# Patient Record
Sex: Female | Born: 1990 | Hispanic: No | Marital: Married | Smoking: Never smoker
Health system: Southern US, Community
[De-identification: ages and names within clinical notes are randomized; demographics above are authoritative.]

## PROBLEM LIST (undated history)

## (undated) ENCOUNTER — Inpatient Hospital Stay (HOSPITAL_COMMUNITY): Payer: Self-pay

## (undated) DIAGNOSIS — Z789 Other specified health status: Secondary | ICD-10-CM

## (undated) HISTORY — PX: NO PAST SURGERIES: SHX2092

---

## 2014-10-16 ENCOUNTER — Inpatient Hospital Stay (HOSPITAL_COMMUNITY)
Admission: AD | Admit: 2014-10-16 | Discharge: 2014-10-16 | Disposition: A | Discharge status: Home / Self Care | Payer: BLUE CROSS/BLUE SHIELD | Source: Ambulatory Visit | Attending: Obstetrics and Gynecology | Admitting: Obstetrics and Gynecology

## 2014-10-16 ENCOUNTER — Encounter (HOSPITAL_COMMUNITY): Payer: Self-pay | Admitting: *Deleted

## 2014-10-16 DIAGNOSIS — O23592 Infection of other part of genital tract in pregnancy, second trimester: Secondary | ICD-10-CM | POA: Insufficient documentation

## 2014-10-16 DIAGNOSIS — A499 Bacterial infection, unspecified: Secondary | ICD-10-CM | POA: Diagnosis not present

## 2014-10-16 DIAGNOSIS — O98811 Other maternal infectious and parasitic diseases complicating pregnancy, first trimester: Secondary | ICD-10-CM

## 2014-10-16 DIAGNOSIS — B373 Candidiasis of vulva and vagina: Secondary | ICD-10-CM | POA: Diagnosis not present

## 2014-10-16 DIAGNOSIS — N760 Acute vaginitis: Secondary | ICD-10-CM | POA: Diagnosis not present

## 2014-10-16 DIAGNOSIS — B3731 Acute candidiasis of vulva and vagina: Secondary | ICD-10-CM

## 2014-10-16 DIAGNOSIS — Z3A23 23 weeks gestation of pregnancy: Secondary | ICD-10-CM | POA: Diagnosis not present

## 2014-10-16 DIAGNOSIS — O98812 Other maternal infectious and parasitic diseases complicating pregnancy, second trimester: Secondary | ICD-10-CM | POA: Insufficient documentation

## 2014-10-16 DIAGNOSIS — N898 Other specified noninflammatory disorders of vagina: Secondary | ICD-10-CM | POA: Diagnosis present

## 2014-10-16 DIAGNOSIS — O0932 Supervision of pregnancy with insufficient antenatal care, second trimester: Secondary | ICD-10-CM

## 2014-10-16 DIAGNOSIS — B9689 Other specified bacterial agents as the cause of diseases classified elsewhere: Secondary | ICD-10-CM | POA: Diagnosis not present

## 2014-10-16 HISTORY — DX: Other specified health status: Z78.9

## 2014-10-16 LAB — URINALYSIS, ROUTINE W REFLEX MICROSCOPIC
BILIRUBIN URINE: NEGATIVE
GLUCOSE, UA: NEGATIVE mg/dL
HGB URINE DIPSTICK: NEGATIVE
KETONES UR: NEGATIVE mg/dL
NITRITE: NEGATIVE
PH: 6.5 (ref 5.0–8.0)
Protein, ur: NEGATIVE mg/dL
SPECIFIC GRAVITY, URINE: 1.025 (ref 1.005–1.030)
Urobilinogen, UA: 0.2 mg/dL (ref 0.0–1.0)

## 2014-10-16 LAB — WET PREP, GENITAL: TRICH WET PREP: NONE SEEN

## 2014-10-16 LAB — URINE MICROSCOPIC-ADD ON

## 2014-10-16 MED ORDER — FLUCONAZOLE 150 MG PO TABS: 150.0000 mg | ORAL_TABLET | Freq: Once | ORAL | Status: DC

## 2014-10-16 MED ORDER — METRONIDAZOLE 500 MG PO TABS: 500.0000 mg | ORAL_TABLET | Freq: Two times a day (BID) | ORAL | Status: DC

## 2014-10-16 NOTE — MAU Note (Signed)
Pt denies having any problems. Has recently moved here from [place] and because is over 20wks she is having trouble finding someone to accept her for prenatal care. She just wants to be sure everything is ok and get signed up for care. Does have some yellow d/c at times and occ itching.

## 2014-10-16 NOTE — Progress Notes (Signed)
No spec used to obtain specimens

## 2014-10-16 NOTE — MAU Provider Note (Signed)
History     CSN: [id number]  Arrival date and time: 10/16/14 1532   First Provider Initiated Contact with Patient 10/16/14 1654      Chief Complaint  Patient presents with  . Possible Pregnancy   HPI Pt is 24 yo G2P1001  pregnant who recently moved here and has not been able to establish prenatal care. Husband is Runner, broadcasting/film/video with [employer] here in [place] and just got his insurance. Pt c/o of vaginal discharge with itching. Pt has had yeast vaginal infections before- tried the vaginal cream and found it very difficult to use and would prefer tablet. Pt denies abd pain, vaginal bleeding, constipation, diarrhea, N/V. Pt has had back pain prior to pregnancy and has not worsened. Pt denies any complications with previous pregnancy that was delivered at term. RN note: Pt denies having any problems. Has recently moved here from [place] and because is over 20wks she is having trouble finding someone to accept her for prenatal care. She just wants to be sure everything is ok and get signed up for care. Does have some yellow d/c at times and occ itching.          Past Medical History  Diagnosis Date  . Medical history non-contributory     Past Surgical History  Procedure Laterality Date  . No past surgeries      Family History  Problem Relation Age of Onset  . Diabetes Mother   . Diabetes Maternal Grandfather   . Diabetes Paternal Grandfather     Social History  Substance Use Topics  . Smoking status: Never Smoker   . Smokeless tobacco: None  . Alcohol Use: No    Allergies: No Known Allergies  Prescriptions prior to admission  Medication Sig Dispense Refill Last Dose  . Prenatal Vit-Fe Fumarate-FA (PRENATAL MULTIVITAMIN) TABS tablet Take 1 tablet by mouth daily at 12 noon.   10/15/2014 at Unknown time    Review of Systems  Constitutional: Negative for fever and chills.  Gastrointestinal: Negative for nausea, vomiting, abdominal pain, diarrhea and  constipation.  Genitourinary: Negative for dysuria.  Neurological: Negative for headaches.   Physical Exam   Blood pressure 106/55, pulse 70, temperature 98.1 F (36.7 C), resp. rate 18, height  (1.626 m), weight 142 lb 6.4 oz (64.592 kg), last menstrual period 05/05/2014.  Physical Exam  Nursing note and vitals reviewed. Constitutional: She is oriented to person, place, and time. She appears well-developed and well-nourished. No distress.  HENT:  Head: Normocephalic.  Eyes: Pupils are equal, round, and reactive to light.  Neck: Normal range of motion. Neck supple.  Cardiovascular: Normal rate.   Respiratory: Effort normal.  GI: Soft. She exhibits no distension. There is no tenderness. There is no rebound and no guarding.  Gravid AGA; FHR 140 bpm with doppler  Genitourinary: Vaginal discharge found.  Blind swab for wet prep and GC and Chlamydia; small-mod amount white thick creamy discharge  Musculoskeletal: Normal range of motion.  Neurological: She is alert and oriented to person, place, and time.  Skin: Skin is warm and dry.  Psychiatric: She has a normal mood and affect.    MAU Course  Procedures Results for orders placed or performed during the hospital encounter of 10/16/14 (from the past 24 hour(s))  Urinalysis, Routine w reflex microscopic (not at [facility])     Status: Abnormal   Collection Time: 10/16/14  4:10 PM  Result Value Ref Range   Color, Urine YELLOW YELLOW   APPearance CLEAR  CLEAR   Specific Gravity, Urine 1.025 1.005 - 1.030   pH 6.5 5.0 - 8.0   Glucose, UA NEGATIVE NEGATIVE mg/dL   Hgb urine dipstick NEGATIVE NEGATIVE   Bilirubin Urine NEGATIVE NEGATIVE   Ketones, ur NEGATIVE NEGATIVE mg/dL   Protein, ur NEGATIVE NEGATIVE mg/dL   Urobilinogen, UA 0.2 0.0 - 1.0 mg/dL   Nitrite NEGATIVE NEGATIVE   Leukocytes, UA MODERATE (A) NEGATIVE  Urine microscopic-add on     Status: None   Collection Time: 10/16/14  4:10 PM  Result Value Ref Range   Squamous  Epithelial / LPF RARE RARE   WBC, UA 0-2 <3 WBC/hpf   RBC / HPF 0-2 <3 RBC/hpf   Bacteria, UA RARE RARE   Urine-Other YEAST   Wet prep, genital     Status: Abnormal   Collection Time: 10/16/14  5:15 PM  Result Value Ref Range   Yeast Wet Prep HPF POC MODERATE (A) NONE SEEN   Trich, Wet Prep NONE SEEN NONE SEEN   Clue Cells Wet Prep HPF POC MODERATE (A) NONE SEEN   WBC, Wet Prep HPF POC MODERATE (A) NONE SEEN  GC/chlamydia pending   Assessment and Plan  Late to care [redacted]w[redacted]d IUP Yeast vaginosis- diflucan  one now and repeat in 3 days if sx persist BV- Flagyl   Order for US anatomy scan with results to be sent to clinic Message to [facility] clinic to call pt with appointment for [facility] appointment  LINEBERRY,SUSAN 10/16/2014, 4:55 PM

## 2014-10-16 NOTE — Discharge Instructions (Signed)
Safe Medications in Pregnancy   Acne: Benzoyl Peroxide Salicylic Acid  Backache/Headache: Tylenol: 2 regular strength every 4 hours OR              2 Extra strength every 6 hours  Colds/Coughs/Allergies: Benadryl (alcohol free) 25 mg every 6 hours as needed Breath right strips Claritin Cepacol throat lozenges Chloraseptic throat spray Cold-Eeze- up to three times per day Cough drops, alcohol free Flonase (by prescription only) Guaifenesin Mucinex Robitussin DM (plain only, alcohol free) Saline nasal spray/drops Sudafed (pseudoephedrine) & Actifed ** use only after [redacted] weeks gestation and if you do not have high blood pressure Tylenol Vicks Vaporub Zinc lozenges Zyrtec   Constipation: Colace Ducolax suppositories Fleet enema Glycerin suppositories Metamucil Milk of magnesia Miralax Senokot Smooth move tea  Diarrhea: Kaopectate Imodium A-D  *NO pepto Bismol  Hemorrhoids: Anusol Anusol HC Preparation H Tucks  Indigestion: Tums Maalox Mylanta Zantac  Pepcid  Insomnia: Benadryl (alcohol free)  every 6 hours as needed Tylenol PM Unisom, no Gelcaps  Leg Cramps: Tums MagGel  Nausea/Vomiting:  Bonine Dramamine Emetrol Ginger extract Sea bands Meclizine  Nausea medication to take during pregnancy:  Unisom (doxylamine succinate 25 mg tablets) Take one tablet daily at bedtime. If symptoms are not adequately controlled, the dose can be increased to a maximum recommended dose of two tablets daily (1/2 tablet in the morning, 1/2 tablet mid-afternoon and one at bedtime). Vitamin B6  tablets. Take one tablet twice a day (up to 200 mg per day).  Skin Rashes: Aveeno products Benadryl cream or  every 6 hours as needed Calamine Lotion 1% cortisone cream  Yeast infection: Gyne-lotrimin 7 Monistat 7   **If taking multiple medications, please check labels to avoid duplicating the same active ingredients **take medication as directed on  the label ** Do not exceed 4000 mg of tylenol in 24 hours **Do not take medications that contain aspirin or ibuprofen   Bacterial Vaginosis Bacterial vaginosis is a vaginal infection that occurs when the normal balance of bacteria in the vagina is disrupted. It results from an overgrowth of certain bacteria. This is the most common vaginal infection in women of childbearing age. Treatment is important to prevent complications, especially in pregnant women, as it can cause a premature delivery. CAUSES  Bacterial vaginosis is caused by an increase in harmful bacteria that are normally present in smaller amounts in the vagina. Several different kinds of bacteria can cause bacterial vaginosis. However, the reason that the condition develops is not fully understood. RISK FACTORS Certain activities or behaviors can put you at an increased risk of developing bacterial vaginosis, including:  Having a new sex partner or multiple sex partners.  Douching.  Using an intrauterine device (IUD) for contraception. Women do not get bacterial vaginosis from toilet seats, bedding, swimming pools, or contact with objects around them. SIGNS AND SYMPTOMS  Some women with bacterial vaginosis have no signs or symptoms. Common symptoms include:  Grey vaginal discharge.  A fishlike odor with discharge, especially after sexual intercourse.  Itching or burning of the vagina and vulva.  Burning or pain with urination. DIAGNOSIS  Your health care provider will take a medical history and examine the vagina for signs of bacterial vaginosis. A sample of vaginal fluid may be taken. Your health care provider will look at this sample under a microscope to check for bacteria and abnormal cells. A vaginal pH test may also be done.  TREATMENT  Bacterial vaginosis may be treated with antibiotic medicines. These may  be given in the form of a pill or a vaginal cream. A second round of antibiotics may be prescribed if the  condition comes back after treatment.  HOME CARE INSTRUCTIONS   Only take over-the-counter or prescription medicines as directed by your health care provider.  If antibiotic medicine was prescribed, take it as directed. Make sure you finish it even if you start to feel better.  Do not have sex until treatment is completed.  Tell all sexual partners that you have a vaginal infection. They should see their health care provider and be treated if they have problems, such as a mild rash or itching.  Practice safe sex by using condoms and only having one sex partner. SEEK MEDICAL CARE IF:   Your symptoms are not improving after 3 days of treatment.  You have increased discharge or pain.  You have a fever. MAKE SURE YOU:   Understand these instructions.  Will watch your condition.  Will get help right away if you are not doing well or get worse. FOR MORE INFORMATION  Centers for Disease Control and Prevention, Division of STD Prevention: [employer] American Sexual Health Association (ASHA): www.ashastd.org  Document Released: 01/05/2005 Document Revised: 10/26/2012 Document Reviewed: 08/17/2012 [facility] Patient Information 2015 [place], [place]. This information is not intended to replace advice given to you by your health care provider. Make sure you discuss any questions you have with your health care provider.  Bacterial Vaginosis Bacterial vaginosis is a vaginal infection that occurs when the normal balance of bacteria in the vagina is disrupted. It results from an overgrowth of certain bacteria. This is the most common vaginal infection in women of childbearing age. Treatment is important to prevent complications, especially in pregnant women, as it can cause a premature delivery. CAUSES  Bacterial vaginosis is caused by an increase in harmful bacteria that are normally present in smaller amounts in the vagina. Several different kinds of bacteria can cause bacterial vaginosis.  However, the reason that the condition develops is not fully understood. RISK FACTORS Certain activities or behaviors can put you at an increased risk of developing bacterial vaginosis, including:  Having a new sex partner or multiple sex partners.  Douching.  Using an intrauterine device (IUD) for contraception. Women do not get bacterial vaginosis from toilet seats, bedding, swimming pools, or contact with objects around them. SIGNS AND SYMPTOMS  Some women with bacterial vaginosis have no signs or symptoms. Common symptoms include:  Grey vaginal discharge.  A fishlike odor with discharge, especially after sexual intercourse.  Itching or burning of the vagina and vulva.  Burning or pain with urination. DIAGNOSIS  Your health care provider will take a medical history and examine the vagina for signs of bacterial vaginosis. A sample of vaginal fluid may be taken. Your health care provider will look at this sample under a microscope to check for bacteria and abnormal cells. A vaginal pH test may also be done.  TREATMENT  Bacterial vaginosis may be treated with antibiotic medicines. These may be given in the form of a pill or a vaginal cream. A second round of antibiotics may be prescribed if the condition comes back after treatment.  HOME CARE INSTRUCTIONS   Only take over-the-counter or prescription medicines as directed by your health care provider.  If antibiotic medicine was prescribed, take it as directed. Make sure you finish it even if you start to feel better.  Do not have sex until treatment is completed.  Tell all sexual partners that  you have a vaginal infection. They should see their health care provider and be treated if they have problems, such as a mild rash or itching.  Practice safe sex by using condoms and only having one sex partner. SEEK MEDICAL CARE IF:   Your symptoms are not improving after 3 days of treatment.  You have increased discharge or pain.  You  have a fever. MAKE SURE YOU:   Understand these instructions.  Will watch your condition.  Will get help right away if you are not doing well or get worse. FOR MORE INFORMATION  Centers for Disease Control and Prevention, Division of STD Prevention: [employer] American Sexual Health Association (ASHA): www.ashastd.org  Document Released: 01/05/2005 Document Revised: 10/26/2012 Document Reviewed: 08/17/2012 [facility] Patient Information 2015 [place], [place]. This information is not intended to replace advice given to you by your health care provider. Make sure you discuss any questions you have with your health care provider.

## 2014-10-17 LAB — GC/CHLAMYDIA PROBE AMP (~~LOC~~) NOT AT ARMC
Chlamydia: NEGATIVE
Neisseria Gonorrhea: NEGATIVE

## 2014-10-29 ENCOUNTER — Encounter: Payer: Self-pay | Admitting: Advanced Practice Midwife

## 2014-10-29 ENCOUNTER — Ambulatory Visit (INDEPENDENT_AMBULATORY_CARE_PROVIDER_SITE_OTHER): Payer: BLUE CROSS/BLUE SHIELD | Admitting: Advanced Practice Midwife

## 2014-10-29 VITALS — BP 107/58 | HR 75 | Temp 98.0°F | Wt 147.3 lb

## 2014-10-29 DIAGNOSIS — O0932 Supervision of pregnancy with insufficient antenatal care, second trimester: Secondary | ICD-10-CM | POA: Insufficient documentation

## 2014-10-29 LAB — POCT URINALYSIS DIP (DEVICE)
Bilirubin Urine: NEGATIVE
Glucose, UA: NEGATIVE mg/dL
Hgb urine dipstick: NEGATIVE
KETONES UR: NEGATIVE mg/dL
Nitrite: NEGATIVE
PH: 7 (ref 5.0–8.0)
PROTEIN: NEGATIVE mg/dL
SPECIFIC GRAVITY, URINE: 1.015 (ref 1.005–1.030)
UROBILINOGEN UA: 0.2 mg/dL (ref 0.0–1.0)

## 2014-10-29 NOTE — Patient Instructions (Signed)
Influenza (Flu) Vaccine (Inactivated or Recombinant):  1. Why get vaccinated? Influenza ("flu") is a contagious disease that spreads around the [place] every year, usually between October and May. Flu is caused by influenza viruses, and is spread mainly by coughing, sneezing, and close contact. Anyone can get flu. Flu strikes suddenly and can last several days. Symptoms vary by age, but can include:  fever/chills  sore throat  muscle aches  fatigue  cough  headache  runny or stuffy nose Flu can also lead to pneumonia and blood infections, and cause diarrhea and seizures in children. If you have a medical condition, such as heart or lung disease, flu can make it worse. Flu is more dangerous for some people. Infants and young children, people 65 years of age and older, pregnant women, and people with certain health conditions or a weakened immune system are at greatest risk. Each year thousands of people in the [place] die from flu, and many more are hospitalized. Flu vaccine can:  keep you from getting flu,  make flu less severe if you do get it, and  keep you from spreading flu to your family and other people. 2. Inactivated and recombinant flu vaccines A dose of flu vaccine is recommended every flu season. Children 6 months through 8 years of age may need two doses during the same flu season. Everyone else needs only one dose each flu season. Some inactivated flu vaccines contain a very small amount of a mercury-based preservative called thimerosal. Studies have not shown thimerosal in vaccines to be harmful, but flu vaccines that do not contain thimerosal are available. There is no live flu virus in flu shots. They cannot cause the flu. There are many flu viruses, and they are always changing. Each year a new flu vaccine is made to protect against three or four viruses that are likely to cause disease in the upcoming flu season. But even when the vaccine doesn't exactly  match these viruses, it may still provide some protection. Flu vaccine cannot prevent:  flu that is caused by a virus not covered by the vaccine, or  illnesses that look like flu but are not. It takes about 2 weeks for protection to develop after vaccination, and protection lasts through the flu season. 3. Some people should not get this vaccine Tell the person who is giving you the vaccine:  If you have any severe, life-threatening allergies. If you ever had a life-threatening allergic reaction after a dose of flu vaccine, or have a severe allergy to any part of this vaccine, you may be advised not to get vaccinated. Most, but not all, types of flu vaccine contain a small amount of egg protein.  If you ever had Guillain-Barre Syndrome (also called GBS). Some people with a history of GBS should not get this vaccine. This should be discussed with your doctor.  If you are not feeling well. It is usually okay to get flu vaccine when you have a mild illness, but you might be asked to come back when you feel better. 4. Risks of a vaccine reaction With any medicine, including vaccines, there is a chance of reactions. These are usually mild and go away on their own, but serious reactions are also possible. Most people who get a flu shot do not have any problems with it. Minor problems following a flu shot include:  soreness, redness, or swelling where the shot was given  hoarseness  sore, red or itchy eyes  cough    fever  aches  headache  itching  fatigue If these problems occur, they usually begin soon after the shot and last 1 or 2 days. More serious problems following a flu shot can include the following:  There may be a small increased risk of Guillain-Barre Syndrome (GBS) after inactivated flu vaccine. This risk has been estimated at 1 or 2 additional cases per million people vaccinated. This is much lower than the risk of severe complications from flu, which can be prevented by  flu vaccine.  Young children who get the flu shot along with pneumococcal vaccine (PCV13) and/or DTaP vaccine at the same time might be slightly more likely to have a seizure caused by fever. Ask your doctor for more information. Tell your doctor if a child who is getting flu vaccine has ever had a seizure. Problems that could happen after any injected vaccine:  People sometimes faint after a medical procedure, including vaccination. Sitting or lying down for about 15 minutes can help prevent fainting, and injuries caused by a fall. Tell your doctor if you feel dizzy, or have vision changes or ringing in the ears.  Some people get severe pain in the shoulder and have difficulty moving the arm where a shot was given. This happens very rarely.  Any medication can cause a severe allergic reaction. Such reactions from a vaccine are very rare, estimated at about 1 in a million doses, and would happen within a few minutes to a few hours after the vaccination. As with any medicine, there is a very remote chance of a vaccine causing a serious injury or death. The safety of vaccines is always being monitored. For more information, visit: www.cdc.gov/vaccinesafety/ 5. What if there is a serious reaction? What should I look for?  Look for anything that concerns you, such as signs of a severe allergic reaction, very high fever, or unusual behavior. Signs of a severe allergic reaction can include hives, swelling of the face and throat, difficulty breathing, a fast heartbeat, dizziness, and weakness. These would start a few minutes to a few hours after the vaccination. What should I do?  If you think it is a severe allergic reaction or other emergency that can't wait, call 9-1-1 and get the person to the nearest hospital. Otherwise, call your doctor.  Reactions should be reported to the Vaccine Adverse Event Reporting System (VAERS). Your doctor should file this report, or you can do it yourself through the  VAERS web site at www.vaers.hhs.gov, or by calling 1-800-822-7967. VAERS does not give medical advice. 6. The National Vaccine Injury Compensation Program The National Vaccine Injury Compensation Program (VICP) is a federal program that was created to compensate people who may have been injured by certain vaccines. Persons who believe they may have been injured by a vaccine can learn about the program and about filing a claim by calling 1-800-338-2382 or visiting the VICP website at www.hrsa.gov/vaccinecompensation. There is a time limit to file a claim for compensation. 7. How can I learn more?  Ask your healthcare provider. He or she can give you the vaccine package insert or suggest other sources of information.  Call your local or state health department.  Contact the Centers for Disease Control and Prevention (CDC):  Call 1-800-232-4636 (1-800-CDC-INFO) or  Visit CDC's website at www.cdc.gov/flu Vaccine Information Statement Inactivated Influenza Vaccine (08/25/2013)   This information is not intended to replace advice given to you by your health care provider. Make sure you discuss any questions you have with   your health care provider.   Document Released: 10/30/2005 Document Revised: 01/26/2014 Document Reviewed: 08/28/2013 Elsevier Interactive Patient Education 2016 Elsevier Inc[employer] Td Vaccine (Tetanus and Diphtheria): What You Need to Know 1. Why get vaccinated? Tetanus  and diphtheria are very serious diseases. They are rare in the [place] today, but people who do become infected often have severe complications. Td vaccine is used to protect adolescents and adults from both of these diseases. Both tetanus and diphtheria are infections caused by bacteria. Diphtheria spreads from person to person through coughing or sneezing. Tetanus-causing bacteria enter the body through cuts, scratches, or wounds. TETANUS (Lockjaw) causes painful muscle tightening and stiffness, usually all  over the body.  It can lead to tightening of muscles in the head and neck so you can't open your mouth, swallow, or sometimes even breathe. Tetanus kills about 1 out of every 10 people who are infected even after receiving the best medical care. DIPHTHERIA can cause a thick coating to form in the back of the throat.  It can lead to breathing problems, paralysis, heart failure, and death. Before vaccines, as many as 200,000 cases of diphtheria and hundreds of cases of tetanus were reported in the [place] each year. Since vaccination began, reports of cases for both diseases have dropped by about 99%. 2. Td vaccine Td vaccine can protect adolescents and adults from tetanus and diphtheria. Td is usually given as a booster dose every 10 years but it can also be given earlier after a severe and dirty wound or burn. Another vaccine, called Tdap, which protects against pertussis in addition to tetanus and diphtheria, is sometimes recommended instead of Td vaccine. Your doctor or the person giving you the vaccine can give you more information. Td may safely be given at the same time as other vaccines. 3. Some people should not get this vaccine  A person who has ever had a life-threatening allergic reaction after a previous dose of any tetanus or diphtheria containing vaccine, OR has a severe allergy to any part of this vaccine, should not get Td vaccine. Tell the person giving the vaccine about any severe allergies.  Talk to your doctor if you:  have seizures or another nervous system problem,  had severe pain or swelling after any vaccine containing diphtheria or tetanus,  ever had a condition called Guillain Barre Syndrome (GBS),  aren't feeling well on the day the shot is scheduled. 4. Risks of a vaccine reaction With any medicine, including vaccines, there is a chance of side effects. These are usually mild and go away on their own. Serious reactions are also possible but are rare. Most  people who get Td vaccine do not have any problems with it. Mild Problems  following Td vaccine: (Did not interfere with activities)  Pain where the shot was given (about 8 people in 10)  Redness or swelling where the shot was given (about 1 person in 4)  Mild fever (rare)  Headache (about 1 person in 4)  Tiredness (about 1 person in 4) Moderate Problems following Td vaccine: (Interfered with activities, but did not require medical attention)  Fever over 102F (rare) Severe Problems  following Td vaccine: (Unable to perform usual activities; required medical attention)  Swelling, severe pain, bleeding and/or redness in the arm where the shot was given (rare). Problems that could happen after any vaccine:  People sometimes faint after a medical procedure, including vaccination. Sitting or lying down for about 15 minutes can help prevent fainting,   and injuries caused by a fall. Tell your doctor if you feel dizzy, or have vision changes or ringing in the ears.  Some people get severe pain in the shoulder and have difficulty moving the arm where a shot was given. This happens very rarely.  Any medication can cause a severe allergic reaction. Such reactions from a vaccine are very rare, estimated at fewer than 1 in a million doses, and would happen within a few minutes to a few hours after the vaccination. As with any medicine, there is a very remote chance of a vaccine causing a serious injury or death. The safety of vaccines is always being monitored. For more information, visit: www.cdc.gov/vaccinesafety/ 5. What if there is a serious reaction? What should I look for?  Look for anything that concerns you, such as signs of a severe allergic reaction, very high fever, or unusual behavior. Signs of a severe allergic reaction can include hives, swelling of the face and throat, difficulty breathing, a fast heartbeat, dizziness, and weakness. These would usually start a few minutes to a few  hours after the vaccination. What should I do?  If you think it is a severe allergic reaction or other emergency that can't wait, call 9-1-1 or get the person to the nearest hospital. Otherwise, call your doctor.  Afterward, the reaction should be reported to the Vaccine Adverse Event Reporting System (VAERS). Your doctor might file this report, or you can do it yourself through the VAERS web site at www.vaers.hhs.gov, or by calling 1-800-822-7967. VAERS does not give medical advice. 6. The National Vaccine Injury Compensation Program The National Vaccine Injury Compensation Program (VICP) is a federal program that was created to compensate people who may have been injured by certain vaccines. Persons who believe they may have been injured by a vaccine can learn about the program and about filing a claim by calling 1-800-338-2382 or visiting the VICP website at www.hrsa.gov/vaccinecompensation. There is a time limit to file a claim for compensation. 7. How can I learn more?  Ask your doctor. He or she can give you the vaccine package insert or suggest other sources of information.  Call your local or state health department.  Contact the Centers for Disease Control and Prevention (CDC):  Call 1-800-232-4636 (1-800-CDC-INFO)  Visit CDC's website at www.cdc.gov/vaccines CDC Td Vaccine VIS (03/14/13)   This information is not intended to replace advice given to you by your health care provider. Make sure you discuss any questions you have with your health care provider.   Document Released: 11/02/2005 Document Revised: 01/26/2014 Document Reviewed: 04/19/2013 Elsevier Interactive Patient Education 2016 Elsevier Inc.  

## 2014-10-29 NOTE — Progress Notes (Signed)
Breastfeeding tip of the week reviewed Initial prenatal labs today

## 2014-10-29 NOTE — Progress Notes (Signed)
New OB. See Smart Set.  Subjective:    Janice Moore (goes by "[name]" or by "[name]")  is a G2P1001 [redacted]w[redacted]d being seen today for her first obstetrical visit.  Her obstetrical history is significant for No prenatal care. Patient does intend to breast feed. Pregnancy history fully reviewed.  She moved from [place] to [place] in 2012.  She had a normal vaginal delivery in [place] in 2014. States baby was 8+9 and she had no problems with the vaginal delivery or with the pregnancy. Denies any general medical problems.   Patient reports no bleeding, no contractions, no cramping and no leaking.  Filed Vitals:   10/29/14 1339  BP: 107/58  Pulse: 75  Temp: 98 F (36.7 C)  Weight: 66.815 kg (147 lb 4.8 oz)    HISTORY: OB History  Gravida Para Term Preterm AB SAB TAB Ectopic Multiple Living  # Outcome Date GA Lbr Len/2nd Weight Sex Delivery Anes PTL Lv  2 Current           1 Term 11/20/12    Heide Scales     Past Medical History  Diagnosis Date  . Medical history non-contributory    Past Surgical History  Procedure Laterality Date  . No past surgeries     Family History  Problem Relation Age of Onset  . Diabetes Mother   . Diabetes Maternal Grandfather   . Diabetes Paternal Grandfather      Exam    Uterus:  Fundal Height: 25 cm  Pelvic Exam:    Perineum: Refuses exam   Vulva: Refuses exam   Vagina:  Refuses exam. Being treated for yeast and BV   pH:    Cervix: Refused exam   Adnexa: not evaluated   Bony Pelvis: proven to 8+9  System: Breast:  normal appearance, no masses or tenderness   Skin: normal coloration and turgor, no rashes    Neurologic: oriented, not examined   Extremities: normal strength, tone, and muscle mass   HEENT neck supple with midline trachea   Mouth/Teeth mucous membranes moist, pharynx normal without lesions   Neck supple   Cardiovascular: regular rate and rhythm   Respiratory:  appears well, vitals  normal, no respiratory distress, acyanotic, normal RR, ear and throat exam is normal, neck free of mass or lymphadenopathy, chest clear, no wheezing, crepitations, rhonchi, normal symmetric air entry   Abdomen: soft, non-tender; bowel sounds normal; no masses,  no organomegaly   Urinary: Refused exam      Assessment:    Pregnancy: G2P1001 Patient Active Problem List   Diagnosis Date Noted  . Late prenatal care affecting pregnancy in second trimester, antepartum 10/29/2014        Plan:     Initial labs drawn. Prenatal vitamins. Problem list reviewed and updated. Genetic Screening discussed First Screen: Too late.  Ultrasound discussed; fetal survey: ordered.  Follow up in 3 weeks. 50% of 30 min visit spent on counseling and coordination of care.   Routines reviewed Desires female only providers. Explained our limitations, but we will try.  Declines pap smear.  Ultrasound ordered for this Friday.   [facility] 10/29/2014

## 2014-10-30 LAB — PRESCRIPTION MONITORING PROFILE (19 PANEL)
AMPHETAMINE/METH: NEGATIVE ng/mL
BARBITURATE SCREEN, URINE: NEGATIVE ng/mL
BENZODIAZEPINE SCREEN, URINE: NEGATIVE ng/mL
BUPRENORPHINE, URINE: NEGATIVE ng/mL
Cannabinoid Scrn, Ur: NEGATIVE ng/mL
Carisoprodol, Urine: NEGATIVE ng/mL
Cocaine Metabolites: NEGATIVE ng/mL
Creatinine, Urine: 50.89 mg/dL (ref >20.0)
Fentanyl, Ur: NEGATIVE ng/mL
MDMA URINE: NEGATIVE ng/mL
METHAQUALONE SCREEN (URINE): NEGATIVE ng/mL
Meperidine, Ur: NEGATIVE ng/mL
Methadone Screen, Urine: NEGATIVE ng/mL
NITRITES URINE, INITIAL: NEGATIVE ug/mL
OPIATE SCREEN, URINE: NEGATIVE ng/mL
Oxycodone Screen, Ur: NEGATIVE ng/mL
PROPOXYPHENE: NEGATIVE ng/mL
Phencyclidine, Ur: NEGATIVE ng/mL
TAPENTADOLUR: NEGATIVE ng/mL
Tramadol Scrn, Ur: NEGATIVE ng/mL
ZOLPIDEM, URINE: NEGATIVE ng/mL
pH, Initial: 8 pH (ref 4.5–8.9)

## 2014-10-30 LAB — OBSTETRIC PANEL
ANTIBODY SCREEN: NEGATIVE
Basophils Absolute: 0 10*3/uL (ref 0.0–0.1)
Basophils Relative: 0 % (ref 0–1)
EOS PCT: 1 % (ref 0–5)
Eosinophils Absolute: 0.1 10*3/uL (ref 0.0–0.7)
HCT: 33.8 % — ABNORMAL LOW (ref 36.0–46.0)
Hemoglobin: 11.5 g/dL — ABNORMAL LOW (ref 12.0–15.0)
Hepatitis B Surface Ag: NEGATIVE
LYMPHS ABS: 1.2 10*3/uL (ref 0.7–4.0)
LYMPHS PCT: 14 % (ref 12–46)
MCH: 28.8 pg (ref 26.0–34.0)
MCHC: 34 g/dL (ref 30.0–36.0)
MCV: 84.7 fL (ref 78.0–100.0)
MONO ABS: 0.5 10*3/uL (ref 0.1–1.0)
MONOS PCT: 6 % (ref 3–12)
MPV: 9.8 fL (ref 8.6–12.4)
Neutro Abs: 7 10*3/uL (ref 1.7–7.7)
Neutrophils Relative %: 79 % — ABNORMAL HIGH (ref 43–77)
PLATELETS: 184 10*3/uL (ref 150–400)
RBC: 3.99 MIL/uL (ref 3.87–5.11)
RDW: 13.6 % (ref 11.5–15.5)
RH TYPE: POSITIVE
RUBELLA: 4.18 {index} — AB (ref <0.90)
WBC: 8.9 10*3/uL (ref 4.0–10.5)

## 2014-10-30 LAB — URINE CULTURE: Colony Count: 30000

## 2014-10-31 LAB — HEMOGLOBINOPATHY EVALUATION
HGB A2 QUANT: 3 % (ref 2.2–3.2)
HGB A: 97 % (ref 96.8–97.8)
HGB F QUANT: 0 % (ref 0.0–2.0)
HGB S QUANTITAION: 0 %
Hemoglobin Other: 0 %

## 2014-11-02 ENCOUNTER — Other Ambulatory Visit: Payer: Self-pay | Admitting: Advanced Practice Midwife

## 2014-11-02 ENCOUNTER — Ambulatory Visit (HOSPITAL_COMMUNITY)
Admission: RE | Admit: 2014-11-02 | Discharge: 2014-11-02 | Disposition: A | Discharge status: Home / Self Care | Payer: BLUE CROSS/BLUE SHIELD | Source: Ambulatory Visit | Attending: Advanced Practice Midwife | Admitting: Advanced Practice Midwife

## 2014-11-02 DIAGNOSIS — Z3A25 25 weeks gestation of pregnancy: Secondary | ICD-10-CM

## 2014-11-02 DIAGNOSIS — O35EXX0 Maternal care for other (suspected) fetal abnormality and damage, fetal genitourinary anomalies, not applicable or unspecified: Secondary | ICD-10-CM

## 2014-11-02 DIAGNOSIS — O283 Abnormal ultrasonic finding on antenatal screening of mother: Secondary | ICD-10-CM | POA: Diagnosis not present

## 2014-11-02 DIAGNOSIS — O358XX0 Maternal care for other (suspected) fetal abnormality and damage, not applicable or unspecified: Secondary | ICD-10-CM

## 2014-11-02 DIAGNOSIS — O0930 Supervision of pregnancy with insufficient antenatal care, unspecified trimester: Secondary | ICD-10-CM

## 2014-11-02 DIAGNOSIS — O0932 Supervision of pregnancy with insufficient antenatal care, second trimester: Secondary | ICD-10-CM

## 2014-11-02 DIAGNOSIS — Z36 Encounter for antenatal screening of mother: Secondary | ICD-10-CM | POA: Diagnosis present

## 2014-11-02 DIAGNOSIS — Z3689 Encounter for other specified antenatal screening: Secondary | ICD-10-CM

## 2014-11-20 ENCOUNTER — Ambulatory Visit (INDEPENDENT_AMBULATORY_CARE_PROVIDER_SITE_OTHER): Payer: BLUE CROSS/BLUE SHIELD | Admitting: Certified Nurse Midwife

## 2014-11-20 VITALS — BP 102/63 | HR 86 | Temp 98.1°F | Wt 151.0 lb

## 2014-11-20 DIAGNOSIS — Z3483 Encounter for supervision of other normal pregnancy, third trimester: Secondary | ICD-10-CM | POA: Diagnosis not present

## 2014-11-20 LAB — POCT URINALYSIS DIP (DEVICE)
BILIRUBIN URINE: NEGATIVE
Glucose, UA: NEGATIVE mg/dL
HGB URINE DIPSTICK: NEGATIVE
Ketones, ur: NEGATIVE mg/dL
LEUKOCYTES UA: NEGATIVE
Nitrite: NEGATIVE
Protein, ur: NEGATIVE mg/dL
Specific Gravity, Urine: 1.01 (ref 1.005–1.030)
Urobilinogen, UA: 0.2 mg/dL (ref 0.0–1.0)
pH: 6.5 (ref 5.0–8.0)

## 2014-11-20 NOTE — Patient Instructions (Signed)
Glucose Tolerance Test The glucose tolerance test (GTT) is one of several tests used to diagnose diabetes mellitus. The GTT is a blood test, and it may include a urine test as well. The GTT checks to see how your body processes sugar (glucose). For this test, you will consume a drink containing a high level of glucose. Your blood glucose levels will be checked before you consume the drink and then again 1, 2, 3, and possibly 4 hours after you consume it. Your health care provider may recommend that you have the GTT if you:  Have a family history of diabetes.   Are very overweight (obese).   Have experienced infections that keep coming back.   Have had numerous cuts or wounds that did not heal quickly, especially on your legs and feet.   Are a woman and have a history of giving birth to very large babies or a history of repeated fetal loss (stillbirth).  Have had glucose in your urine or high blood sugar:   During pregnancy.   After a heart attack, surgery, or prolonged periods of high stress.  The GTT lasts 3-4 hours. Other than the glucose solution, you will not be allowed to eat or drink anything during the test. You must remain at the testing location to make sure that your blood and urine samples are taken on time. PREPARATION FOR TEST Eat normally for 3 days prior to the GTT test, including having plenty of carbohydrate-rich foods. Do not eat or drink anything except water during the final 12 hours before the test. You should not smoke or exercise during the test. In addition, your health care provider may ask you to stop taking certain medicines before the test. RESULTS It is your responsibility to obtain your test results. Ask the lab or department performing the test when and how you will get your results. Contact your health care provider to discuss any questions you have about your results. Range of Normal Values Ranges for normal values may vary among different labs and  hospitals. You should always check with your health care provider after having lab work or other tests done to discuss whether your values are considered within normal limits.  Normal levels of blood glucose are as follows:  Fasting: less than 110 mg/dL or less than 6.1 mmol/L (SI units).  1 hour after consuming the glucose drink: less than 200 mg/dL or less than 11.1 mmol/L.  2 hours after consuming the glucose drink: less than 140 mg/dL or less than 7.8 mmol/L.  3 hours after consuming the glucose drink: 70-115 mg/dL or less than 6.4 mmol/L.  4 hours after consuming the glucose drink: 70-115 mg/dL or less than 6.4 mmol/L. The normal result for the urine test is negative, meaning that glucose is absent from your urine. Some substances can interfere with GTT results. These may include:  Blood pressure and heart failure medicines, including beta blockers, furosemide, and thiazides.   Anti-inflammatory medicines, including aspirin.   Nicotine.   Some psychiatric medicines.   Oral contraceptives.   Diuretics or corticosteroids. Meaning of Results Outside Normal Value Ranges GTT test results that are above normal values may indicate health problems, such as:  Diabetes mellitus.   Acute stress response.   Cushing syndrome.   Tumors such as pheochromocytoma or glucagonoma.   Chronic renal failure.   Pancreatitis.   Hyperthyroidism.   Current infection.  Discuss your test results with your health care provider. He or she will use the results   to make a diagnosis and determine a treatment plan that is right for you.   This information is not intended to replace advice given to you by your health care provider. Make sure you discuss any questions you have with your health care provider.   Document Released: 01/29/2004 Document Revised: 01/26/2014 Document Reviewed: 05/12/2013 Elsevier Interactive Patient Education 2016 Elsevier Inc.  

## 2014-11-20 NOTE — Progress Notes (Signed)
Discussed scheduling  one hour glucose another day. Patient wants to discuss with provider.

## 2014-11-20 NOTE — Progress Notes (Signed)
  Subjective:  Janice Moore is a 24 y.o. G2P1001 at 265w3d being seen today for ongoing prenatal care.  Patient reports no complaints.  Contractions: Not present.  Vag. Bleeding: None. Movement: Present. Denies leaking of fluid.   The following portions of the patient's history were reviewed and updated as appropriate: allergies, current medications, past family history, past medical history, past social history, past surgical history and problem list. Problem list updated.  Objective:   Filed Vitals:   11/20/14 1527  BP: 102/63  Pulse: 86  Temp: 98.1 F (36.7 C)  Weight: 151 lb (68.493 kg)    Fetal Status: Fetal Heart Rate (bpm): 150   Movement: Present     General:  Alert, oriented and cooperative. Patient is in no acute distress.  Skin: Skin is warm and dry. No rash noted.   Cardiovascular: Normal heart rate noted  Respiratory: Normal respiratory effort, no problems with respiration noted  Abdomen: Soft, gravid, appropriate for gestational age. Pain/Pressure: Present     Pelvic: Vag. Bleeding: None     Cervical exam deferred        Extremities: Normal range of motion.  Edema: Trace  Mental Status: Normal mood and affect. Normal behavior. Normal judgment and thought content.   Urinalysis: Urine Protein: Negative Urine Glucose: Negative  Assessment and Plan:  Pregnancy: G2P1001 at [redacted]w[redacted]d  There are no diagnoses linked to this encounter. Preterm labor symptoms and general obstetric precautions including but not limited to vaginal bleeding, contractions, leaking of fluid and fetal movement were reviewed in detail with the patient. Please refer to After Visit Summary for other counseling recommendations.  Return for needs 1 hr gtt.only asap.  Discussed ultrasound results Needs repeat u/s in 4 weeks Rhea PinkLori A Clemmons, CNM

## 2014-11-23 ENCOUNTER — Other Ambulatory Visit: Payer: BLUE CROSS/BLUE SHIELD

## 2014-11-28 ENCOUNTER — Encounter: Payer: Self-pay | Admitting: Family Medicine

## 2014-11-28 ENCOUNTER — Other Ambulatory Visit: Payer: BLUE CROSS/BLUE SHIELD

## 2014-11-28 DIAGNOSIS — O0932 Supervision of pregnancy with insufficient antenatal care, second trimester: Secondary | ICD-10-CM

## 2014-11-29 LAB — GLUCOSE TOLERANCE, 1 HOUR (50G) W/O FASTING: Glucose, 1 Hour GTT: 80 mg/dL (ref 70–140)

## 2014-12-03 ENCOUNTER — Ambulatory Visit (INDEPENDENT_AMBULATORY_CARE_PROVIDER_SITE_OTHER): Payer: BLUE CROSS/BLUE SHIELD | Admitting: Advanced Practice Midwife

## 2014-12-03 ENCOUNTER — Encounter: Payer: Self-pay | Admitting: Advanced Practice Midwife

## 2014-12-03 VITALS — BP 117/70 | HR 82 | Temp 97.4°F | Wt 152.9 lb

## 2014-12-03 DIAGNOSIS — O0932 Supervision of pregnancy with insufficient antenatal care, second trimester: Secondary | ICD-10-CM

## 2014-12-03 DIAGNOSIS — O358XX0 Maternal care for other (suspected) fetal abnormality and damage, not applicable or unspecified: Secondary | ICD-10-CM

## 2014-12-03 DIAGNOSIS — O35EXX0 Maternal care for other (suspected) fetal abnormality and damage, fetal genitourinary anomalies, not applicable or unspecified: Secondary | ICD-10-CM

## 2014-12-03 NOTE — Patient Instructions (Signed)

## 2014-12-03 NOTE — Progress Notes (Signed)
Pain-pelvic 

## 2014-12-03 NOTE — Progress Notes (Signed)
Subjective:  Janice Moore is a 24 y.o. G2P1001 at 10067w2d being seen today for ongoing prenatal care.  Patient reports no complaints and did have one sharp pain and one spot of blood last week.   Did not have contracitons.  Baby moves a lot   Contractions: Not present.  Vag. Bleeding: None. Movement: Present. Denies leaking of fluid.   The following portions of the patient's history were reviewed and updated as appropriate: allergies, current medications, past family history, past medical history, past social history, past surgical history and problem list. Problem list updated.  Objective:   Filed Vitals:   12/03/14 1612  BP: 117/70  Pulse: 82  Temp: 97.4 F (36.3 C)  Weight: 152 lb 14.4 oz (69.355 kg)    Fetal Status: Fetal Heart Rate (bpm): 141   Movement: Present     General:  Alert, oriented and cooperative. Patient is in no acute distress.  Skin: Skin is warm and dry. No rash noted.   Cardiovascular: Normal heart rate noted  Respiratory: Normal respiratory effort, no problems with respiration noted  Abdomen: Soft, gravid, appropriate for gestational age. Pain/Pressure: Present     Pelvic: Vag. Bleeding: None     Cervical exam deferred        Extremities: Normal range of motion.  Edema: None  Mental Status: Normal mood and affect. Normal behavior. Normal judgment and thought content.   Urinalysis:      Assessment and Plan:  Pregnancy: G2P1001 at 5967w2d  1. Pregnancy complicated by fetal genitourinary abnormality, not applicable or unspecified fetus      Scheduled MFM f/u US  - US MFM OB FOLLOW UP; Future  2. Late prenatal care affecting pregnancy in second trimester, antepartum     Discussed preterm labor signs and informed needs to call or come in if she has any contractions >5/hr, or any bleeding  Preterm labor symptoms and general obstetric precautions including but not limited to vaginal bleeding, contractions, leaking of fluid and fetal movement were reviewed  in detail with the patient. Please refer to After Visit Summary for other counseling recommendations.  Return in about 2 weeks (around 12/17/2014) for [facility].   Aviva SignsMarie L Nabila Albarracin, CNM

## 2014-12-14 ENCOUNTER — Ambulatory Visit (HOSPITAL_COMMUNITY)
Admission: RE | Admit: 2014-12-14 | Discharge: 2014-12-14 | Disposition: A | Discharge status: Home / Self Care | Payer: BLUE CROSS/BLUE SHIELD | Source: Ambulatory Visit | Attending: Advanced Practice Midwife | Admitting: Advanced Practice Midwife

## 2014-12-14 DIAGNOSIS — Z3A31 31 weeks gestation of pregnancy: Secondary | ICD-10-CM | POA: Diagnosis not present

## 2014-12-14 DIAGNOSIS — O283 Abnormal ultrasonic finding on antenatal screening of mother: Secondary | ICD-10-CM | POA: Insufficient documentation

## 2014-12-14 DIAGNOSIS — O358XX0 Maternal care for other (suspected) fetal abnormality and damage, not applicable or unspecified: Secondary | ICD-10-CM

## 2014-12-14 DIAGNOSIS — O35EXX0 Maternal care for other (suspected) fetal abnormality and damage, fetal genitourinary anomalies, not applicable or unspecified: Secondary | ICD-10-CM

## 2014-12-14 DIAGNOSIS — O0932 Supervision of pregnancy with insufficient antenatal care, second trimester: Secondary | ICD-10-CM

## 2014-12-18 ENCOUNTER — Ambulatory Visit (INDEPENDENT_AMBULATORY_CARE_PROVIDER_SITE_OTHER): Payer: BLUE CROSS/BLUE SHIELD | Admitting: Certified Nurse Midwife

## 2014-12-18 VITALS — BP 90/50 | HR 80 | Temp 97.6°F | Wt 153.8 lb

## 2014-12-18 DIAGNOSIS — Z3493 Encounter for supervision of normal pregnancy, unspecified, third trimester: Secondary | ICD-10-CM | POA: Diagnosis not present

## 2014-12-18 LAB — POCT URINALYSIS DIP (DEVICE)
Bilirubin Urine: NEGATIVE
Glucose, UA: NEGATIVE mg/dL
Hgb urine dipstick: NEGATIVE
Ketones, ur: NEGATIVE mg/dL
Nitrite: NEGATIVE
Protein, ur: NEGATIVE mg/dL
Specific Gravity, Urine: 1.025 (ref 1.005–1.030)
Urobilinogen, UA: 0.2 mg/dL (ref 0.0–1.0)
pH: 7 (ref 5.0–8.0)

## 2014-12-18 NOTE — Progress Notes (Signed)
  Subjective:  Janice Moore is a 24 y.o. G2P1001 at 5713w3d being seen today for ongoing prenatal care.  She is currently monitored for the following issues for this low-risk pregnancy and has Late prenatal care affecting pregnancy in second trimester, antepartum on her problem list.  Patient reports no complaints.  Contractions: Not present. Vag. Bleeding: None.  Movement: Present. Denies leaking of fluid.   The following portions of the patient's history were reviewed and updated as appropriate: allergies, current medications, past family history, past medical history, past social history, past surgical history and problem list. Problem list updated.  Objective:   Filed Vitals:   12/18/14 1547  BP: 90/50  Pulse: 80  Temp: 97.6 F (36.4 C)  Weight: 153 lb 12.8 oz (69.763 kg)    Fetal Status: Fetal Heart Rate (bpm): 150   Movement: Present     General:  Alert, oriented and cooperative. Patient is in no acute distress.  Skin: Skin is warm and dry. No rash noted.   Cardiovascular: Normal heart rate noted  Respiratory: Normal respiratory effort, no problems with respiration noted  Abdomen: Soft, gravid, appropriate for gestational age. Pain/Pressure: Present     Pelvic: Vag. Bleeding: None     Cervical exam deferred        Extremities: Normal range of motion.  Edema: None  Mental Status: Normal mood and affect. Normal behavior. Normal judgment and thought content.   Urinalysis: Urine Protein: Negative Urine Glucose: Negative  Assessment and Plan:  Pregnancy: G2P1001 at 3213w3d  There are no diagnoses linked to this encounter. Preterm labor symptoms and general obstetric precautions including but not limited to vaginal bleeding, contractions, leaking of fluid and fetal movement were reviewed in detail with the patient. Please refer to After Visit Summary for other counseling recommendations.  Return in about 2 weeks (around 01/01/2015).   Rhea PinkLori A Waseem Suess, CNM

## 2014-12-18 NOTE — Patient Instructions (Signed)

## 2014-12-18 NOTE — Progress Notes (Signed)
Pt reports back and leg pain.  Breastfeeding tip of the week reviewed.

## 2015-01-01 ENCOUNTER — Ambulatory Visit (INDEPENDENT_AMBULATORY_CARE_PROVIDER_SITE_OTHER): Payer: BLUE CROSS/BLUE SHIELD | Admitting: Student

## 2015-01-01 VITALS — BP 115/74 | HR 100 | Temp 98.0°F | Wt 155.7 lb

## 2015-01-01 DIAGNOSIS — G4762 Sleep related leg cramps: Secondary | ICD-10-CM

## 2015-01-01 DIAGNOSIS — O0932 Supervision of pregnancy with insufficient antenatal care, second trimester: Secondary | ICD-10-CM

## 2015-01-01 DIAGNOSIS — L299 Pruritus, unspecified: Secondary | ICD-10-CM

## 2015-01-01 LAB — POCT URINALYSIS DIP (DEVICE)
BILIRUBIN URINE: NEGATIVE
Glucose, UA: NEGATIVE mg/dL
HGB URINE DIPSTICK: NEGATIVE
KETONES UR: NEGATIVE mg/dL
Nitrite: NEGATIVE
PH: 6.5 (ref 5.0–8.0)
PROTEIN: NEGATIVE mg/dL
SPECIFIC GRAVITY, URINE: 1.015 (ref 1.005–1.030)
Urobilinogen, UA: 0.2 mg/dL (ref 0.0–1.0)

## 2015-01-01 NOTE — Patient Instructions (Signed)
Third Trimester of Pregnancy The third trimester is from week 29 through week 42, months 7 through 9. The third trimester is a time when the fetus is growing rapidly. At the end of the ninth month, the fetus is about 20 inches in length and weighs 6-10 pounds.  BODY CHANGES Your body goes through many changes during pregnancy. The changes vary from woman to woman.   Your weight will continue to increase. You can expect to gain 25-35 pounds (11-16 kg) by the end of the pregnancy.  You may begin to get stretch marks on your hips, abdomen, and breasts.  You may urinate more often because the fetus is moving lower into your pelvis and pressing on your bladder.  You may develop or continue to have heartburn as a result of your pregnancy.  You may develop constipation because certain hormones are causing the muscles that push waste through your intestines to slow down.  You may develop hemorrhoids or swollen, bulging veins (varicose veins).  You may have pelvic pain because of the weight gain and pregnancy hormones relaxing your joints between the bones in your pelvis. Backaches may result from overexertion of the muscles supporting your posture.  You may have changes in your hair. These can include thickening of your hair, rapid growth, and changes in texture. Some women also have hair loss during or after pregnancy, or hair that feels dry or thin. Your hair will most likely return to normal after your baby is born.  Your breasts will continue to grow and be tender. A yellow discharge may leak from your breasts called colostrum.  Your belly button may stick out.  You may feel short of breath because of your expanding uterus.  You may notice the fetus "dropping," or moving lower in your abdomen.  You may have a bloody mucus discharge. This usually occurs a few days to a week before labor begins.  Your cervix becomes thin and soft (effaced) near your due date. WHAT TO EXPECT AT YOUR PRENATAL  EXAMS  You will have prenatal exams every 2 weeks until week 36. Then, you will have weekly prenatal exams. During a routine prenatal visit:  You will be weighed to make sure you and the fetus are growing normally.  Your blood pressure is taken.  Your abdomen will be measured to track your baby's growth.  The fetal heartbeat will be listened to.  Any test results from the previous visit will be discussed.  You may have a cervical check near your due date to see if you have effaced. At around 36 weeks, your caregiver will check your cervix. At the same time, your caregiver will also perform a test on the secretions of the vaginal tissue. This test is to determine if a type of bacteria, Group B streptococcus, is present. Your caregiver will explain this further. Your caregiver may ask you:  What your birth plan is.  How you are feeling.  If you are feeling the baby move.  If you have had any abnormal symptoms, such as leaking fluid, bleeding, severe headaches, or abdominal cramping.  If you are using any tobacco products, including cigarettes, chewing tobacco, and electronic cigarettes.  If you have any questions. Other tests or screenings that may be performed during your third trimester include:  Blood tests that check for low iron levels (anemia).  Fetal testing to check the health, activity level, and growth of the fetus. Testing is done if you have certain medical conditions or if   there are problems during the pregnancy.  HIV (human immunodeficiency virus) testing. If you are at high risk, you may be screened for HIV during your third trimester of pregnancy. FALSE LABOR You may feel small, irregular contractions that eventually go away. These are called Braxton Hicks contractions, or false labor. Contractions may last for hours, days, or even weeks before true labor sets in. If contractions come at regular intervals, intensify, or become painful, it is best to be seen by your  caregiver.  SIGNS OF LABOR   Menstrual-like cramps.  Contractions that are 5 minutes apart or less.  Contractions that start on the top of the uterus and spread down to the lower abdomen and back.  A sense of increased pelvic pressure or back pain.  A watery or bloody mucus discharge that comes from the vagina. If you have any of these signs before the 37th week of pregnancy, call your caregiver right away. You need to go to the hospital to get checked immediately. HOME CARE INSTRUCTIONS   Avoid all smoking, herbs, alcohol, and unprescribed drugs. These chemicals affect the formation and growth of the baby.  Do not use any tobacco products, including cigarettes, chewing tobacco, and electronic cigarettes. If you need help quitting, ask your health care provider. You may receive counseling support and other resources to help you quit.  Follow your caregiver's instructions regarding medicine use. There are medicines that are either safe or unsafe to take during pregnancy.  Exercise only as directed by your caregiver. Experiencing uterine cramps is a good sign to stop exercising.  Continue to eat regular, healthy meals.  Wear a good support bra for breast tenderness.  Do not use hot tubs, steam rooms, or saunas.  Wear your seat belt at all times when driving.  Avoid raw meat, uncooked cheese, cat litter boxes, and soil used by cats. These carry germs that can cause birth defects in the baby.  Take your prenatal vitamins.  Take 1500-2000 mg of calcium daily starting at the 20th week of pregnancy until you deliver your baby.  Try taking a stool softener (if your caregiver approves) if you develop constipation. Eat more high-fiber foods, such as fresh vegetables or fruit and whole grains. Drink plenty of fluids to keep your urine clear or pale yellow.  Take warm sitz baths to soothe any pain or discomfort caused by hemorrhoids. Use hemorrhoid cream if your caregiver approves.  If  you develop varicose veins, wear support hose. Elevate your feet for 15 minutes, 3-4 times a day. Limit salt in your diet.  Avoid heavy lifting, wear low heal shoes, and practice good posture.  Rest a lot with your legs elevated if you have leg cramps or low back pain.  Visit your dentist if you have not gone during your pregnancy. Use a soft toothbrush to brush your teeth and be gentle when you floss.  A sexual relationship may be continued unless your caregiver directs you otherwise.  Do not travel far distances unless it is absolutely necessary and only with the approval of your caregiver.  Take prenatal classes to understand, practice, and ask questions about the labor and delivery.  Make a trial run to the hospital.  Pack your hospital bag.  Prepare the baby's nursery.  Continue to go to all your prenatal visits as directed by your caregiver. SEEK MEDICAL CARE IF:  You are unsure if you are in labor or if your water has broken.  You have dizziness.  You have  mild pelvic cramps, pelvic pressure, or nagging pain in your abdominal area.  You have persistent nausea, vomiting, or diarrhea.  You have a bad smelling vaginal discharge.  You have pain with urination. SEEK IMMEDIATE MEDICAL CARE IF:   You have a fever.  You are leaking fluid from your vagina.  You have spotting or bleeding from your vagina.  You have severe abdominal cramping or pain.  You have rapid weight loss or gain.  You have shortness of breath with chest pain.  You notice sudden or extreme swelling of your face, hands, ankles, feet, or legs.  You have not felt your baby move in over an hour.  You have severe headaches that do not go away with medicine.  You have vision changes.   This information is not intended to replace advice given to you by your health care provider. Make sure you discuss any questions you have with your health care provider.   Document Released: 12/30/2000 Document  Revised: 01/26/2014 Document Reviewed: 03/08/2012 Elsevier Interactive Patient Education 2016 [employer] Fetal Movement Counts Patient Name: __________________________________________________ Patient Due Date: ____________________ Performing a fetal movement count is highly recommended in high-risk pregnancies, but it is good for every pregnant woman to do. Your health care provider may ask you to start counting fetal movements at 28 weeks of the pregnancy. Fetal movements often increase:  After eating a full meal.  After physical activity.  After eating or drinking something sweet or cold.  At rest. Pay attention to when you feel the baby is most active. This will help you notice a pattern of your baby's sleep and wake cycles and what factors contribute to an increase in fetal movement. It is important to perform a fetal movement count at the same time each day when your baby is normally most active.  HOW TO COUNT FETAL MOVEMENTS  Find a quiet and comfortable area to sit or lie down on your left side. Lying on your left side provides the best blood and oxygen circulation to your baby.  Write down the day and time on a sheet of paper or in a journal.  Start counting kicks, flutters, swishes, rolls, or jabs in a 2-hour period. You should feel at least 10 movements within 2 hours.  If you do not feel 10 movements in 2 hours, wait 2-3 hours and count again. Look for a change in the pattern or not enough counts in 2 hours. SEEK MEDICAL CARE IF:  You feel less than 10 counts in 2 hours, tried twice.  There is no movement in over an hour.  The pattern is changing or taking longer each day to reach 10 counts in 2 hours.  You feel the baby is not moving as he or she usually does. Date: ____________ Movements: ____________ Start time: ____________ Doreatha Martin time: ____________  Date: ____________ Movements: ____________ Start time: ____________ Doreatha Martin time: ____________ Date: ____________  Movements: ____________ Start time: ____________ Doreatha Martin time: ____________ Date: ____________ Movements: ____________ Start time: ____________ Doreatha Martin time: ____________ Date: ____________ Movements: ____________ Start time: ____________ Doreatha Martin time: ____________ Date: ____________ Movements: ____________ Start time: ____________ Doreatha Martin time: ____________ Date: ____________ Movements: ____________ Start time: ____________ Doreatha Martin time: ____________ Date: ____________ Movements: ____________ Start time: ____________ Doreatha Martin time: ____________  Date: ____________ Movements: ____________ Start time: ____________ Doreatha Martin time: ____________ Date: ____________ Movements: ____________ Start time: ____________ Doreatha Martin time: ____________ Date: ____________ Movements: ____________ Start time: ____________ Doreatha Martin time: ____________ Date: ____________ Movements: ____________ Start time: ____________ Doreatha Martin time: ____________ Date:  ____________ Movements: ____________ Start time: ____________ Doreatha MartinFinish time: ____________ Date: ____________ Movements: ____________ Start time: ____________ Doreatha MartinFinish time: ____________ Date: ____________ Movements: ____________ Start time: ____________ Doreatha MartinFinish time: ____________  Date: ____________ Movements: ____________ Start time: ____________ Doreatha MartinFinish time: ____________ Date: ____________ Movements: ____________ Start time: ____________ Doreatha MartinFinish time: ____________ Date: ____________ Movements: ____________ Start time: ____________ Doreatha MartinFinish time: ____________ Date: ____________ Movements: ____________ Start time: ____________ Doreatha MartinFinish time: ____________ Date: ____________ Movements: ____________ Start time: ____________ Doreatha MartinFinish time: ____________ Date: ____________ Movements: ____________ Start time: ____________ Doreatha MartinFinish time: ____________ Date: ____________ Movements: ____________ Start time: ____________ Doreatha MartinFinish time: ____________  Date: ____________ Movements: ____________ Start time:  ____________ Doreatha MartinFinish time: ____________ Date: ____________ Movements: ____________ Start time: ____________ Doreatha MartinFinish time: ____________ Date: ____________ Movements: ____________ Start time: ____________ Doreatha MartinFinish time: ____________ Date: ____________ Movements: ____________ Start time: ____________ Doreatha MartinFinish time: ____________ Date: ____________ Movements: ____________ Start time: ____________ Doreatha MartinFinish time: ____________ Date: ____________ Movements: ____________ Start time: ____________ Doreatha MartinFinish time: ____________ Date: ____________ Movements: ____________ Start time: ____________ Doreatha MartinFinish time: ____________  Date: ____________ Movements: ____________ Start time: ____________ Doreatha MartinFinish time: ____________ Date: ____________ Movements: ____________ Start time: ____________ Doreatha MartinFinish time: ____________ Date: ____________ Movements: ____________ Start time: ____________ Doreatha MartinFinish time: ____________ Date: ____________ Movements: ____________ Start time: ____________ Doreatha MartinFinish time: ____________ Date: ____________ Movements: ____________ Start time: ____________ Doreatha MartinFinish time: ____________ Date: ____________ Movements: ____________ Start time: ____________ Doreatha MartinFinish time: ____________ Date: ____________ Movements: ____________ Start time: ____________ Doreatha MartinFinish time: ____________  Date: ____________ Movements: ____________ Start time: ____________ Doreatha MartinFinish time: ____________ Date: ____________ Movements: ____________ Start time: ____________ Doreatha MartinFinish time: ____________ Date: ____________ Movements: ____________ Start time: ____________ Doreatha MartinFinish time: ____________ Date: ____________ Movements: ____________ Start time: ____________ Doreatha MartinFinish time: ____________ Date: ____________ Movements: ____________ Start time: ____________ Doreatha MartinFinish time: ____________ Date: ____________ Movements: ____________ Start time: ____________ Doreatha MartinFinish time: ____________ Date: ____________ Movements: ____________ Start time: ____________ Doreatha MartinFinish time: ____________  Date:  ____________ Movements: ____________ Start time: ____________ Doreatha MartinFinish time: ____________ Date: ____________ Movements: ____________ Start time: ____________ Doreatha MartinFinish time: ____________ Date: ____________ Movements: ____________ Start time: ____________ Doreatha MartinFinish time: ____________ Date: ____________ Movements: ____________ Start time: ____________ Doreatha MartinFinish time: ____________ Date: ____________ Movements: ____________ Start time: ____________ Doreatha MartinFinish time: ____________ Date: ____________ Movements: ____________ Start time: ____________ Doreatha MartinFinish time: ____________ Date: ____________ Movements: ____________ Start time: ____________ Doreatha MartinFinish time: ____________  Date: ____________ Movements: ____________ Start time: ____________ Doreatha MartinFinish time: ____________ Date: ____________ Movements: ____________ Start time: ____________ Doreatha MartinFinish time: ____________ Date: ____________ Movements: ____________ Start time: ____________ Doreatha MartinFinish time: ____________ Date: ____________ Movements: ____________ Start time: ____________ Doreatha MartinFinish time: ____________ Date: ____________ Movements: ____________ Start time: ____________ Doreatha MartinFinish time: ____________ Date: ____________ Movements: ____________ Start time: ____________ Doreatha MartinFinish time: ____________   This information is not intended to replace advice given to you by your health care provider. Make sure you discuss any questions you have with your health care provider.   Document Released: 02/04/2006 Document Revised: 01/26/2014 Document Reviewed: 11/02/2011 Elsevier Interactive Patient Education 2016 [employer] Preterm Labor Information Preterm labor is when labor starts at less than 37 weeks of pregnancy. The normal length of a pregnancy is 39 to 41 weeks. CAUSES Often, there is no identifiable underlying cause as to why a woman goes into preterm labor. One of the most common known causes of preterm labor is infection. Infections of the uterus, cervix, vagina, amniotic sac, bladder, kidney,  or even the lungs (pneumonia) can cause labor to start. Other suspected causes of preterm labor include:   Urogenital infections, such as yeast infections and bacterial vaginosis.   Uterine abnormalities (uterine shape, uterine septum, fibroids,  or bleeding from the placenta).   A cervix that has been operated on (it may fail to stay closed).   Malformations in the fetus.   Multiple gestations (twins, triplets, and so on).   Breakage of the amniotic sac.  RISK FACTORS  Having a previous history of preterm labor.   Having premature rupture of membranes (PROM).   Having a placenta that covers the opening of the cervix (placenta previa).   Having a placenta that separates from the uterus (placental abruption).   Having a cervix that is too weak to hold the fetus in the uterus (incompetent cervix).   Having too much fluid in the amniotic sac (polyhydramnios).   Taking illegal drugs or smoking while pregnant.   Not gaining enough weight while pregnant.   Being younger than 23 and older than 26 years old.   Having a low socioeconomic status.   Being African American. SYMPTOMS Signs and symptoms of preterm labor include:   Menstrual-like cramps, abdominal pain, or back pain.  Uterine contractions that are regular, as frequent as six in an hour, regardless of their intensity (may be mild or painful).  Contractions that start on the top of the uterus and spread down to the lower abdomen and back.   A sense of increased pelvic pressure.   A watery or bloody mucus discharge that comes from the vagina.  TREATMENT Depending on the length of the pregnancy and other circumstances, your health care provider may suggest bed rest. If necessary, there are medicines that can be given to stop contractions and to mature the fetal lungs. If labor happens before 34 weeks of pregnancy, a prolonged hospital stay may be recommended. Treatment depends on the condition of both  you and the fetus.  WHAT SHOULD YOU DO IF YOU THINK YOU ARE IN PRETERM LABOR? Call your health care provider right away. You will need to go to the hospital to get checked immediately. HOW CAN YOU PREVENT PRETERM LABOR IN FUTURE PREGNANCIES? You should:   Stop smoking if you smoke.  Maintain healthy weight gain and avoid chemicals and drugs that are not necessary.  Be watchful for any type of infection.  Inform your health care provider if you have a known history of preterm labor.   This information is not intended to replace advice given to you by your health care provider. Make sure you discuss any questions you have with your health care provider.   Document Released: 03/28/2003 Document Revised: 09/07/2012 Document Reviewed: 02/08/2012 Elsevier Interactive Patient Education [employer]

## 2015-01-01 NOTE — Progress Notes (Signed)
Subjective:  Janice Moore is a 24 y.o. G2P1001 at 3760w3d being seen today for ongoing prenatal care.  She is currently monitored for the following issues for this low-risk pregnancy and has Late prenatal care affecting pregnancy in second trimester, antepartum on her problem list.  Patient reports backache, nocturnal leg cramps, and itching.  Contractions: Not present. Vag. Bleeding: None.  Movement: Present. Denies leaking of fluid.  Reports constant low back pain that is achy & worse with walking.  States has generalized itching nightly and feels like her skin is dry. Denies change in soap, lotion, or detergent; denies rash. No abdominal pain, n/v.  Also reports occasional nocturnal leg cramps of BLE.   The following portions of the patient's history were reviewed and updated as appropriate: allergies, current medications, past family history, past medical history, past social history, past surgical history and problem list. Problem list updated.  Objective:   Filed Vitals:   01/01/15 1549  BP: 115/74  Pulse: 100  Temp: 98 F (36.7 C)  Weight: 155 lb 11.2 oz (70.625 kg)    Fetal Status: Fetal Heart Rate (bpm): 154   Movement: Present     General:  Alert, oriented and cooperative. Patient is in no acute distress.  Skin: Skin is warm and dry. No rash noted.   Cardiovascular: Normal heart rate noted  Respiratory: Normal respiratory effort, no problems with respiration noted  Abdomen: Soft, gravid, appropriate for gestational age. Pain/Pressure: Present    Fundal height 34cm  Pelvic: Vag. Bleeding: None     Cervical exam deferred        Extremities: Normal range of motion.  Edema: None  Mental Status: Normal mood and affect. Normal behavior. Normal judgment and thought content.   Urinalysis:      Assessment and Plan:  Pregnancy: G2P1001 at 3060w3d  1. Late prenatal care affecting pregnancy in second trimester, antepartum   2. Itching  - apply lotion after showers -avoid  heavily scented lotions  3. Nocturnal leg cramps  -discussed leg exercises to do during cramp & for prevention  4. Low back pain in pregnancy  -maternity support belt   There are no diagnoses linked to this encounter. Preterm labor symptoms and general obstetric precautions including but not limited to vaginal bleeding, contractions, leaking of fluid and fetal movement were reviewed in detail with the patient. Please refer to After Visit Summary for other counseling recommendations.  Return in about 2 weeks (around 01/15/2015).   Judeth HornErin Elisia Stepp, NP

## 2015-01-08 ENCOUNTER — Encounter: Payer: BLUE CROSS/BLUE SHIELD | Admitting: Advanced Practice Midwife

## 2015-01-09 ENCOUNTER — Ambulatory Visit (INDEPENDENT_AMBULATORY_CARE_PROVIDER_SITE_OTHER): Payer: Self-pay | Admitting: Certified Nurse Midwife

## 2015-01-09 VITALS — BP 114/74 | HR 78 | Temp 98.3°F | Wt 158.3 lb

## 2015-01-09 DIAGNOSIS — Z3483 Encounter for supervision of other normal pregnancy, third trimester: Secondary | ICD-10-CM

## 2015-01-09 LAB — POCT URINALYSIS DIP (DEVICE)
BILIRUBIN URINE: NEGATIVE
GLUCOSE, UA: NEGATIVE mg/dL
KETONES UR: NEGATIVE mg/dL
Nitrite: NEGATIVE
Protein, ur: NEGATIVE mg/dL
SPECIFIC GRAVITY, URINE: 1.02 (ref 1.005–1.030)
Urobilinogen, UA: 0.2 mg/dL (ref 0.0–1.0)
pH: 6 (ref 5.0–8.0)

## 2015-01-09 MED ORDER — TERCONAZOLE 0.4 % VA CREA: 1.0000 | TOPICAL_CREAM | Freq: Every day | VAGINAL | Status: DC

## 2015-01-09 NOTE — Progress Notes (Signed)
Pt states she thinks she has a yeast infection. She does not want to be check but she would like medicine.

## 2015-01-09 NOTE — Patient Instructions (Signed)

## 2015-01-09 NOTE — Progress Notes (Signed)
Subjective:  Janice Moore is a 24 y.o. G2P1001 at 2461w4d being seen today for ongoing prenatal care.  She is currently monitored for the following issues for this low-risk pregnancy and has Late prenatal care affecting pregnancy in second trimester, antepartum on her problem list.  Patient reports no complaints.  Contractions: Not present. Vag. Bleeding: None.  Movement: Present. Denies leaking of fluid.   The following portions of the patient's history were reviewed and updated as appropriate: allergies, current medications, past family history, past medical history, past social history, past surgical history and problem list. Problem list updated.  Objective:   Filed Vitals:   01/09/15 1125  BP: 114/74  Pulse: 78  Temp: 98.3 F (36.8 C)  Weight: 158 lb 4.8 oz (71.804 kg)    Fetal Status: Fetal Heart Rate (bpm): 155   Movement: Present     General:  Alert, oriented and cooperative. Patient is in no acute distress.  Skin: Skin is warm and dry. No rash noted.   Cardiovascular: Normal heart rate noted  Respiratory: Normal respiratory effort, no problems with respiration noted  Abdomen: Soft, gravid, appropriate for gestational age. Pain/Pressure: Present     Pelvic: Vag. Bleeding: None Vag D/C Character: Yellow   Cervical exam deferred        Extremities: Normal range of motion.  Edema: None  Mental Status: Normal mood and affect. Normal behavior. Normal judgment and thought content.   Urinalysis:      Assessment and Plan:  Pregnancy: G2P1001 at 2661w4d  There are no diagnoses linked to this encounter. Preterm labor symptoms and general obstetric precautions including but not limited to vaginal bleeding, contractions, leaking of fluid and fetal movement were reviewed in detail with the patient. Please refer to After Visit Summary for other counseling recommendations.  Return in about 1 week (around 01/16/2015).   Rhea PinkLori A Flecia Shutter, CNM

## 2015-01-15 ENCOUNTER — Ambulatory Visit (INDEPENDENT_AMBULATORY_CARE_PROVIDER_SITE_OTHER): Payer: Self-pay | Admitting: Certified Nurse Midwife

## 2015-01-15 VITALS — BP 106/63 | HR 79 | Temp 97.9°F | Wt 161.0 lb

## 2015-01-15 DIAGNOSIS — Z113 Encounter for screening for infections with a predominantly sexual mode of transmission: Secondary | ICD-10-CM

## 2015-01-15 DIAGNOSIS — O0933 Supervision of pregnancy with insufficient antenatal care, third trimester: Secondary | ICD-10-CM

## 2015-01-15 LAB — POCT URINALYSIS DIP (DEVICE)
Bilirubin Urine: NEGATIVE
Glucose, UA: NEGATIVE mg/dL
Hgb urine dipstick: NEGATIVE
Ketones, ur: NEGATIVE mg/dL
Nitrite: NEGATIVE
Protein, ur: NEGATIVE mg/dL
Specific Gravity, Urine: 1.02 (ref 1.005–1.030)
Urobilinogen, UA: 0.2 mg/dL (ref 0.0–1.0)
pH: 7 (ref 5.0–8.0)

## 2015-01-15 LAB — OB RESULTS CONSOLE GBS: STREP GROUP B AG: POSITIVE

## 2015-01-15 LAB — OB RESULTS CONSOLE GC/CHLAMYDIA: Gonorrhea: NEGATIVE

## 2015-01-15 NOTE — Progress Notes (Signed)
Patient states she has been using terazol cream for 3 days and has had a reduction in yeast infection symptoms but it hasn't gone away completely yet

## 2015-01-15 NOTE — Progress Notes (Signed)
Subjective:  Janice Moore is a 24 y.o. G2P1001 at 6675w3d being seen today for ongoing prenatal care.  She is currently monitored for the following issues for this low-risk pregnancy and has Late prenatal care affecting pregnancy in second trimester, antepartum on her problem list.  Patient reports no complaints.  Contractions: Not present. Vag. Bleeding: None.  Movement: Present. Denies leaking of fluid.   The following portions of the patient's history were reviewed and updated as appropriate: allergies, current medications, past family history, past medical history, past social history, past surgical history and problem list. Problem list updated.  Objective:   Filed Vitals:   01/15/15 1557  BP: 106/63  Pulse: 79  Temp: 97.9 F (36.6 C)  Weight: 161 lb (73.029 kg)    Fetal Status: Fetal Heart Rate (bpm): 161   Movement: Present     General:  Alert, oriented and cooperative. Patient is in no acute distress.  Skin: Skin is warm and dry. No rash noted.   Cardiovascular: Normal heart rate noted  Respiratory: Normal respiratory effort, no problems with respiration noted  Abdomen: Soft, gravid, appropriate for gestational age. Pain/Pressure: Present     Pelvic: Vag. Bleeding: None Vag D/C Character: White   Cervical exam performed        Extremities: Normal range of motion.  Edema: None  Mental Status: Normal mood and affect. Normal behavior. Normal judgment and thought content.   Urinalysis: Urine Protein: Negative Urine Glucose: Negative  Assessment and Plan:  Pregnancy: G2P1001 at 4075w3d  1. Late prenatal care affecting pregnancy in third trimester  - Culture, beta strep (group b only) - GC[facility]Chlamydia probe amp ([place]  Term labor symptoms and general obstetric precautions including but not limited to vaginal bleeding, contractions, leaking of fluid and fetal movement were reviewed in detail with the patient. Please refer to After Visit Summary for other  counseling recommendations.  Return in about 1 week (around 01/22/2015).   Rhea PinkLori A Hilmer Aliberti, CNM

## 2015-01-15 NOTE — Patient Instructions (Signed)
Group B streptococcus (GBS) is a type of bacteria often found in healthy women. GBS is not the same as the bacteria that causes strep throat. You may have GBS in your vagina, rectum, or bladder. GBS does not spread through sexual contact, but it can be passed to a baby during childbirth. This can be dangerous for your baby. It is not dangerous to you and usually does not cause any symptoms. Your health care provider may test you for GBS when your pregnancy is between 35 and 37 weeks. GBS is dangerous only during birth, so there is no need to test for it earlier. It is possible to have GBS during pregnancy and never pass it to your baby. If your test results are positive for GBS, your health care provider may recommend giving you antibiotic medicine during delivery to make sure your baby stays healthy. RISK FACTORS You are more likely to pass GBS to your baby if:   Your water breaks (ruptured membrane) or you go into labor before 37 weeks.  Your water breaks 18 hours before you deliver.  You passed GBS during a previous pregnancy.  You have a urinary tract infection caused by GBS any time during pregnancy.  You have a fever during labor. SYMPTOMS Most women who have GBS do not have any symptoms. If you have a urinary tract infection caused by GBS, you might have frequent or painful urination and fever. Babies who get GBS usually show symptoms within 7 days of birth. Symptoms may include:   Breathing problems.  Heart and blood pressure problems.  Digestive and kidney problems. DIAGNOSIS Routine screening for GBS is recommended for all pregnant women. A health care provider takes a sample of the fluid in your vagina and rectum with a swab. It is then sent to a lab to be checked for GBS. A sample of your urine may also be checked for the bacteria.  TREATMENT If you test positive for GBS, you may need treatment with an antibiotic medicine during labor. As soon as you go into labor, or as soon as  your membranes rupture, you will get the antibiotic medicine through an IV access. You will continue to get the medicine until after you give birth. You do not need antibiotic medicine if you are having a cesarean delivery.If your baby shows signs or symptoms of GBS after birth, your baby can also be treated with an antibiotic medicine. HOME CARE INSTRUCTIONS   Take all antibiotic medicine as prescribed by your health care provider. Only take medicine as directed.   Continue with prenatal visits and care.   Keep all follow-up appointments.  SEEK MEDICAL CARE IF:   You have pain when you urinate.   You have to urinate frequently.   You have a fever.  SEEK IMMEDIATE MEDICAL CARE IF:   Your membranes rupture.  You go into labor.   This information is not intended to replace advice given to you by your health care provider. Make sure you discuss any questions you have with your health care provider.   Document Released: 04/14/2007 Document Revised: 01/10/2013 Document Reviewed: 10/28/2012 Elsevier Interactive Patient Education 2016 [employer] Inc[employer]  

## 2015-01-16 LAB — CULTURE, BETA STREP (GROUP B ONLY)

## 2015-01-19 LAB — GC/CHLAMYDIA PROBE AMP (~~LOC~~) NOT AT ARMC
Chlamydia: NEGATIVE
Neisseria Gonorrhea: NEGATIVE

## 2015-01-20 NOTE — L&D Delivery Note (Signed)
Patient is 25 y.o. G2P1001 [redacted]w[redacted]d admitted for IOL for oligohydramnios.    Delivery Note At 3:42 AM a viable female was delivered via Vaginal, Spontaneous Delivery (Presentation: Left Occiput Anterior). Newborn had loose nuchal x1 which was able to be reduced.  APGAR: 9, 9; weight: pending.   Placenta status: Intact, Spontaneous.  Cord: 3 vessels with the following complications: None.  Cord pH: not obtained   Anesthesia: None  Episiotomy: None Lacerations: 1st degree Suture Repair: 3.0 vicryl Est. Blood Loss (mL): 352  Mom to postpartum.  Baby to Couplet care / Skin to Skin.  Palma Holter 02/13/2015, 4:19 AM   Upon arrival patient was complete and pushing. She pushed with good maternal effort to deliver a healthy baby boy. Baby delivered without difficulty, was noted to have good tone and place on maternal abdomen for oral suctioning, drying and stimulation. Delayed cord clamping performed. Placenta delivered intact with 3V cord. Vaginal canal and perineum was inspected and a 1st degree perineal tear was repaired; hemostatic. Pitocin was started and uterus massaged until bleeding slowed. Counts of sharps, instruments, and lap pads were all correct.   Palma Holter, MD PGY 1 Family Medicine

## 2015-01-21 ENCOUNTER — Encounter: Payer: Self-pay | Admitting: Advanced Practice Midwife

## 2015-01-22 ENCOUNTER — Encounter: Payer: BLUE CROSS/BLUE SHIELD | Admitting: Advanced Practice Midwife

## 2015-01-22 ENCOUNTER — Ambulatory Visit (INDEPENDENT_AMBULATORY_CARE_PROVIDER_SITE_OTHER): Payer: BLUE CROSS/BLUE SHIELD | Admitting: Family Medicine

## 2015-01-22 VITALS — BP 119/73 | HR 83 | Temp 98.1°F | Wt 164.0 lb

## 2015-01-22 DIAGNOSIS — Z3483 Encounter for supervision of other normal pregnancy, third trimester: Secondary | ICD-10-CM

## 2015-01-22 DIAGNOSIS — Z3490 Encounter for supervision of normal pregnancy, unspecified, unspecified trimester: Secondary | ICD-10-CM | POA: Insufficient documentation

## 2015-01-22 DIAGNOSIS — Z22330 Carrier of Group B streptococcus: Secondary | ICD-10-CM

## 2015-01-22 DIAGNOSIS — O99820 Streptococcus B carrier state complicating pregnancy: Secondary | ICD-10-CM | POA: Insufficient documentation

## 2015-01-22 DIAGNOSIS — O0932 Supervision of pregnancy with insufficient antenatal care, second trimester: Secondary | ICD-10-CM

## 2015-01-22 DIAGNOSIS — O0933 Supervision of pregnancy with insufficient antenatal care, third trimester: Secondary | ICD-10-CM

## 2015-01-22 LAB — POCT URINALYSIS DIP (DEVICE)
BILIRUBIN URINE: NEGATIVE
Glucose, UA: NEGATIVE mg/dL
HGB URINE DIPSTICK: NEGATIVE
Ketones, ur: NEGATIVE mg/dL
NITRITE: NEGATIVE
PH: 6.5 (ref 5.0–8.0)
Protein, ur: NEGATIVE mg/dL
SPECIFIC GRAVITY, URINE: 1.02 (ref 1.005–1.030)
UROBILINOGEN UA: 0.2 mg/dL (ref 0.0–1.0)

## 2015-01-22 NOTE — Progress Notes (Signed)
Subjective:  Janice Moore is a 25 y.o. G2P1001 at 4527w3d being seen today for ongoing prenatal care.  She is currently monitored for the following issues for this low-risk pregnancy and has Late prenatal care affecting pregnancy in second trimester, antepartum; Supervision of normal pregnancy, antepartum; and Group B Streptococcus carrier, +RV culture, currently pregnant on her problem list.  Patient reports backache.  Contractions: Not present. Vag. Bleeding: None.  Movement: Present. Denies leaking of fluid.   The following portions of the patient's history were reviewed and updated as appropriate: allergies, current medications, past family history, past medical history, past social history, past surgical history and problem list. Problem list updated.  Objective:   Filed Vitals:   01/22/15 1502  BP: 119/73  Pulse: 83  Temp: 98.1 F (36.7 C)  Weight: 164 lb (74.39 kg)    Fetal Status: Fetal Heart Rate (bpm): 150 Fundal Height: 38 cm Movement: Present     General:  Alert, oriented and cooperative. Patient is in no acute distress.  Skin: Skin is warm and dry. No rash noted.   Cardiovascular: Normal heart rate noted  Respiratory: Normal respiratory effort, no problems with respiration noted  Abdomen: Soft, gravid, appropriate for gestational age. Pain/Pressure: Present     Pelvic: Vag. Bleeding: None Vag D/C Character: White   Cervical exam deferred        Extremities: Normal range of motion.  Edema: Trace  Mental Status: Normal mood and affect. Normal behavior. Normal judgment and thought content.   Urinalysis: Urine Protein: Negative Urine Glucose: Negative  Assessment and Plan:  Pregnancy: G2P1001 at 6927w3d  1. Supervision of normal pregnancy, antepartum, third trimester -Updated problem list and box  2. Late prenatal care affecting pregnancy in second trimester, antepartum -[facility]  Preterm labor symptoms and general obstetric precautions including but  not limited to vaginal bleeding, contractions, leaking of fluid and fetal movement were reviewed in detail with the patient. Please refer to After Visit Summary for other counseling recommendations.  Return in about 1 week (around 01/29/2015) for Routine prenatal care.   Federico FlakeKimberly Niles Newton, MD

## 2015-01-22 NOTE — Progress Notes (Signed)
Patient still having itching and d/c from yeast infection Reviewed tip of week with patient Large leuks on UA

## 2015-01-29 ENCOUNTER — Encounter: Payer: Self-pay | Admitting: Advanced Practice Midwife

## 2015-01-29 ENCOUNTER — Ambulatory Visit (INDEPENDENT_AMBULATORY_CARE_PROVIDER_SITE_OTHER): Payer: BLUE CROSS/BLUE SHIELD | Admitting: Advanced Practice Midwife

## 2015-01-29 VITALS — BP 111/68 | HR 98 | Temp 97.8°F | Wt 165.3 lb

## 2015-01-29 DIAGNOSIS — Z3483 Encounter for supervision of other normal pregnancy, third trimester: Secondary | ICD-10-CM | POA: Diagnosis not present

## 2015-01-29 LAB — POCT URINALYSIS DIP (DEVICE)
Bilirubin Urine: NEGATIVE
GLUCOSE, UA: NEGATIVE mg/dL
Hgb urine dipstick: NEGATIVE
NITRITE: NEGATIVE
PROTEIN: 30 mg/dL — AB
UROBILINOGEN UA: 0.2 mg/dL (ref 0.0–1.0)
pH: 6 (ref 5.0–8.0)

## 2015-01-29 NOTE — Progress Notes (Signed)
Urine, large amt wbcs Breastfeeding tip of the week reviewed Terazol cream finished, c/o continued vaginal itching/irritation

## 2015-01-29 NOTE — Progress Notes (Signed)
Subjective:  Janice Moore is a 25 y.o. G2P1001 at 1341w3d being seen today for ongoing prenatal care.  She is currently monitored for the following issues for this low-risk pregnancy and has Late prenatal care affecting pregnancy in second trimester, antepartum; Supervision of normal pregnancy, antepartum; and Group B Streptococcus carrier, +RV culture, currently pregnant on her problem list.  Patient reports backache.  Contractions: Irregular. Vag. Bleeding: None.  Movement: Present. Denies leaking of fluid.   The following portions of the patient's history were reviewed and updated as appropriate: allergies, current medications, past family history, past medical history, past social history, past surgical history and problem list. Problem list updated.  Objective:   Filed Vitals:   01/29/15 1555  BP: 111/68  Pulse: 98  Temp: 97.8 F (36.6 C)  Weight: 74.98 kg (165 lb 4.8 oz)    Fetal Status: Fetal Heart Rate (bpm): 145   Movement: Present     General:  Alert, oriented and cooperative. Patient is in no acute distress.  Skin: Skin is warm and dry. No rash noted.   Cardiovascular: Normal heart rate noted  Respiratory: Normal respiratory effort, no problems with respiration noted  Abdomen: Soft, gravid, appropriate for gestational age. Pain/Pressure: Present     Pelvic: Vag. Bleeding: None Vag D/C Character: White   Cervical exam performed        Extremities: Normal range of motion.  Edema: Trace  Mental Status: Normal mood and affect. Normal behavior. Normal judgment and thought content.   Urinalysis: Urine Protein: 1+ Urine Glucose: Negative  Assessment and Plan:  Pregnancy: G2P1001 at 5241w3d                      Discussed labor.  Had a 9 hour labor last time. No epidural                      Discussed this labor may go faster. Patient is walking and stretching every day due to leg cramps, discussed this is helpful  SIUP at 1441w3d  Back pain associated with late  pregnancy  Term labor symptoms and general obstetric precautions including but not limited to vaginal bleeding, contractions, leaking of fluid and fetal movement were reviewed in detail with the patient. Please refer to After Visit Summary for other counseling recommendations.   RTC in 1 week and weekly therafter until delivery  Aviva SignsMarie L Kavan Devan, CNM

## 2015-01-29 NOTE — Patient Instructions (Signed)
Vaginal Delivery °During delivery, your health care provider will help you give birth to your baby. During a vaginal delivery, you will work to push the baby out of your vagina. However, before you can push your baby out, a few things need to happen. The opening of your uterus (cervix) has to soften, thin out, and open up (dilate) all the way to 10 cm. Also, your baby has to move down from the uterus into your vagina.  °SIGNS OF LABOR  °Your health care provider will first need to make sure you are in labor. Signs of labor include:  °· Passing what is called the mucous plug before labor begins. This is a small amount of blood-stained mucus. °· Having regular, painful uterine contractions.   °· The time between contractions gets shorter.   °· The discomfort and pain gradually get more intense. °· Contraction pains get worse when walking and do not go away when resting.   °· Your cervix becomes thinner (effacement) and dilates. °BEFORE THE DELIVERY °Once you are in labor and admitted into the hospital or care center, your health care provider may do the following:  °· Perform a complete physical exam. °· Review any complications related to pregnancy or labor.  °· Check your blood pressure, pulse, temperature, and heart rate (vital signs).   °· Determine if, and when, the rupture of amniotic membranes occurred. °· Do a vaginal exam (using a sterile glove and lubricant) to determine:   °¨ The position (presentation) of the baby. Is the baby's head presenting first (vertex) in the birth canal (vagina), or are the feet or buttocks first (breech)?   °¨ The level (station) of the baby's head within the birth canal.   °¨ The effacement and dilatation of the cervix.   °· An electronic fetal monitor is usually placed on your abdomen when you first arrive. This is used to monitor your contractions and the baby's heart rate. °¨ When the monitor is on your abdomen (external fetal monitor), it can only pick up the frequency and  length of your contractions. It cannot tell the strength of your contractions. °¨ If it becomes necessary for your health care provider to know exactly how strong your contractions are or to see exactly what the baby's heart rate is doing, an internal monitor may be inserted into your vagina and uterus. Your health care provider will discuss the benefits and risks of using an internal monitor and obtain your permission before inserting the device. °¨ Continuous fetal monitoring may be needed if you have an epidural, are receiving certain medicines (such as oxytocin), or have pregnancy or labor complications. °· An IV access tube may be placed into a vein in your arm to deliver fluids and medicines if necessary. °THREE STAGES OF LABOR AND DELIVERY °Normal labor and delivery is divided into three stages. °First Stage °This stage starts when you begin to contract regularly and your cervix begins to efface and dilate. It ends when your cervix is completely open (fully dilated). The first stage is the longest stage of labor and can last from 3 hours to 15 hours.  °Several methods are available to help with labor pain. You and your health care provider will decide which option is best for you. Options include:  °· Opioid medicines. These are strong pain medicines that you can get through your IV tube or as a shot into your muscle. These medicines lessen pain but do not make it go away completely.  °· Epidural. A medicine is given through a thin tube that   is inserted in your back. The medicine numbs the lower part of your body and prevents any pain in that area. °· Paracervical pain medicine. This is an injection of an anesthetic on each side of your cervix.   °· You may request natural childbirth, which does not involve the use of pain medicines or an epidural during labor and delivery. Instead, you will use other things, such as breathing exercises, to help cope with the pain. °Second Stage °The second stage of labor  begins when your cervix is fully dilated at 10 cm. It continues until you push your baby down through the birth canal and the baby is born. This stage can take only minutes or several hours. °· The location of your baby's head as it moves through the birth canal is reported as a number called a station. If the baby's head has not started its descent, the station is described as being at minus 3 (-3). When your baby's head is at the zero station, it is at the middle of the birth canal and is engaged in the pelvis. The station of your baby helps indicate the progress of the second stage of labor. °· When your baby is born, your health care provider may hold the baby with his or her head lowered to prevent amniotic fluid, mucus, and blood from getting into the baby's lungs. The baby's mouth and nose may be suctioned with a small bulb syringe to remove any additional fluid. °· Your health care provider may then place the baby on your stomach. It is important to keep the baby from getting cold. To do this, the health care provider will dry the baby off, place the baby directly on your skin (with no blankets between you and the baby), and cover the baby with warm, dry blankets.   °· The umbilical cord is cut. °Third Stage °During the third stage of labor, your health care provider will deliver the placenta (afterbirth) and make sure your bleeding is under control. The delivery of the placenta usually takes about 5 minutes but can take up to 30 minutes. After the placenta is delivered, a medicine may be given either by IV or injection to help contract the uterus and control bleeding. If you are planning to breastfeed, you can try to do so now. °After you deliver the placenta, your uterus should contract and get very firm. If your uterus does not remain firm, your health care provider will massage it. This is important because the contraction of the uterus helps cut off bleeding at the site where the placenta was attached  to your uterus. If your uterus does not contract properly and stay firm, you may continue to bleed heavily. If there is a lot of bleeding, medicines may be given to contract the uterus and stop the bleeding.  °  °This information is not intended to replace advice given to you by your health care provider. Make sure you discuss any questions you have with your health care provider. °  °Document Released: 10/15/2007 Document Revised: 01/26/2014 Document Reviewed: 09/02/2011 °Elsevier Interactive Patient Education ©2016 [employer] ° °

## 2015-02-05 ENCOUNTER — Ambulatory Visit (INDEPENDENT_AMBULATORY_CARE_PROVIDER_SITE_OTHER): Payer: BLUE CROSS/BLUE SHIELD | Admitting: Student

## 2015-02-05 VITALS — BP 125/74 | HR 76 | Temp 98.3°F | Wt 166.4 lb

## 2015-02-05 DIAGNOSIS — Z3483 Encounter for supervision of other normal pregnancy, third trimester: Secondary | ICD-10-CM | POA: Diagnosis not present

## 2015-02-05 DIAGNOSIS — O98813 Other maternal infectious and parasitic diseases complicating pregnancy, third trimester: Secondary | ICD-10-CM

## 2015-02-05 DIAGNOSIS — B373 Candidiasis of vulva and vagina: Secondary | ICD-10-CM

## 2015-02-05 DIAGNOSIS — B3731 Acute candidiasis of vulva and vagina: Secondary | ICD-10-CM

## 2015-02-05 LAB — POCT URINALYSIS DIP (DEVICE)
Bilirubin Urine: NEGATIVE
Glucose, UA: NEGATIVE mg/dL
Hgb urine dipstick: NEGATIVE
Ketones, ur: NEGATIVE mg/dL
Nitrite: NEGATIVE
Protein, ur: NEGATIVE mg/dL
UROBILINOGEN UA: 0.2 mg/dL (ref 0.0–1.0)
pH: 6 (ref 5.0–8.0)

## 2015-02-05 MED ORDER — FLUCONAZOLE 150 MG PO TABS: 150.0000 mg | ORAL_TABLET | Freq: Once | ORAL | Status: DC

## 2015-02-05 NOTE — Patient Instructions (Signed)
Braxton Hicks Contractions °Contractions of the uterus can occur throughout pregnancy. Contractions are not always a sign that you are in labor.  °WHAT ARE BRAXTON HICKS CONTRACTIONS?  °Contractions that occur before labor are called Braxton Hicks contractions, or false labor. Toward the end of pregnancy (32-34 weeks), these contractions can develop more often and may become more forceful. This is not true labor because these contractions do not result in opening (dilatation) and thinning of the cervix. They are sometimes difficult to tell apart from true labor because these contractions can be forceful and people have different pain tolerances. You should not feel embarrassed if you go to the hospital with false labor. Sometimes, the only way to tell if you are in true labor is for your health care provider to look for changes in the cervix. °If there are no prenatal problems or other health problems associated with the pregnancy, it is completely safe to be sent home with false labor and await the onset of true labor. °HOW CAN YOU TELL THE DIFFERENCE BETWEEN TRUE AND FALSE LABOR? °False Labor °· The contractions of false labor are usually shorter and not as hard as those of true labor.   °· The contractions are usually irregular.   °· The contractions are often felt in the front of the lower abdomen and in the groin.   °· The contractions may go away when you walk around or change positions while lying down.   °· The contractions get weaker and are shorter lasting as time goes on.   °· The contractions do not usually become progressively stronger, regular, and closer together as with true labor.   °True Labor °· Contractions in true labor last 30-70 seconds, become very regular, usually become more intense, and increase in frequency.   °· The contractions do not go away with walking.   °· The discomfort is usually felt in the top of the uterus and spreads to the lower abdomen and low back.   °· True labor can be  determined by your health care provider with an exam. This will show that the cervix is dilating and getting thinner.   °WHAT TO REMEMBER °· Keep up with your usual exercises and follow other instructions given by your health care provider.   °· Take medicines as directed by your health care provider.   °· Keep your regular prenatal appointments.   °· Eat and drink lightly if you think you are going into labor.   °· If Braxton Hicks contractions are making you uncomfortable:   °¨ Change your position from lying down or resting to walking, or from walking to resting.   °¨ Sit and rest in a tub of warm water.   °¨ Drink 2-3 glasses of water. Dehydration may cause these contractions.   °¨ Do slow and deep breathing several times an hour.   °WHEN SHOULD I SEEK IMMEDIATE MEDICAL CARE? °Seek immediate medical care if: °· Your contractions become stronger, more regular, and closer together.   °· You have fluid leaking or gushing from your vagina.   °· You have a fever.   °· You pass blood-tinged mucus.   °· You have vaginal bleeding.   °· You have continuous abdominal pain.   °· You have low back pain that you never had before.   °· You feel your baby's head pushing down and causing pelvic pressure.   °· Your baby is not moving as much as it used to.   °  °This information is not intended to replace advice given to you by your health care provider. Make sure you discuss any questions you have with your health care   provider. °  °Document Released: 01/05/2005 Document Revised: 01/10/2013 Document Reviewed: 10/17/2012 °Elsevier Interactive Patient Education ©2016 [employer] °Fetal Movement Counts °Patient Name: __________________________________________________ Patient Due Date: ____________________ °Performing a fetal movement count is highly recommended in high-risk pregnancies, but it is good for every pregnant woman to do. Your health care provider may ask you to start counting fetal movements at 28 weeks of the  pregnancy. Fetal movements often increase: °· After eating a full meal. °· After physical activity. °· After eating or drinking something sweet or cold. °· At rest. °Pay attention to when you feel the baby is most active. This will help you notice a pattern of your baby's sleep and wake cycles and what factors contribute to an increase in fetal movement. It is important to perform a fetal movement count at the same time each day when your baby is normally most active.  °HOW TO COUNT FETAL MOVEMENTS °· Find a quiet and comfortable area to sit or lie down on your left side. Lying on your left side provides the best blood and oxygen circulation to your baby. °· Write down the day and time on a sheet of paper or in a journal. °· Start counting kicks, flutters, swishes, rolls, or jabs in a 2-hour period. You should feel at least 10 movements within 2 hours. °· If you do not feel 10 movements in 2 hours, wait 2-3 hours and count again. Look for a change in the pattern or not enough counts in 2 hours. °SEEK MEDICAL CARE IF: °· You feel less than 10 counts in 2 hours, tried twice. °· There is no movement in over an hour. °· The pattern is changing or taking longer each day to reach 10 counts in 2 hours. °· You feel the baby is not moving as he or she usually does. °Date: ____________ Movements: ____________ Start time: ____________ Finish time: ____________  °Date: ____________ Movements: ____________ Start time: ____________ Finish time: ____________ °Date: ____________ Movements: ____________ Start time: ____________ Finish time: ____________ °Date: ____________ Movements: ____________ Start time: ____________ Finish time: ____________ °Date: ____________ Movements: ____________ Start time: ____________ Finish time: ____________ °Date: ____________ Movements: ____________ Start time: ____________ Finish time: ____________ °Date: ____________ Movements: ____________ Start time: ____________ Finish time: ____________ °Date:  ____________ Movements: ____________ Start time: ____________ Finish time: ____________  °Date: ____________ Movements: ____________ Start time: ____________ Finish time: ____________ °Date: ____________ Movements: ____________ Start time: ____________ Finish time: ____________ °Date: ____________ Movements: ____________ Start time: ____________ Finish time: ____________ °Date: ____________ Movements: ____________ Start time: ____________ Finish time: ____________ °Date: ____________ Movements: ____________ Start time: ____________ Finish time: ____________ °Date: ____________ Movements: ____________ Start time: ____________ Finish time: ____________ °Date: ____________ Movements: ____________ Start time: ____________ Finish time: ____________  °Date: ____________ Movements: ____________ Start time: ____________ Finish time: ____________ °Date: ____________ Movements: ____________ Start time: ____________ Finish time: ____________ °Date: ____________ Movements: ____________ Start time: ____________ Finish time: ____________ °Date: ____________ Movements: ____________ Start time: ____________ Finish time: ____________ °Date: ____________ Movements: ____________ Start time: ____________ Finish time: ____________ °Date: ____________ Movements: ____________ Start time: ____________ Finish time: ____________ °Date: ____________ Movements: ____________ Start time: ____________ Finish time: ____________  °Date: ____________ Movements: ____________ Start time: ____________ Finish time: ____________ °Date: ____________ Movements: ____________ Start time: ____________ Finish time: ____________ °Date: ____________ Movements: ____________ Start time: ____________ Finish time: ____________ °Date: ____________ Movements: ____________ Start time: ____________ Finish time: ____________ °Date: ____________ Movements: ____________ Start time: ____________ Finish time: ____________ °Date: ____________ Movements: ____________ Start  time: ____________ Finish time:   ____________ °Date: ____________ Movements: ____________ Start time: ____________ Finish time: ____________  °Date: ____________ Movements: ____________ Start time: ____________ Finish time: ____________ °Date: ____________ Movements: ____________ Start time: ____________ Finish time: ____________ °Date: ____________ Movements: ____________ Start time: ____________ Finish time: ____________ °Date: ____________ Movements: ____________ Start time: ____________ Finish time: ____________ °Date: ____________ Movements: ____________ Start time: ____________ Finish time: ____________ °Date: ____________ Movements: ____________ Start time: ____________ Finish time: ____________ °Date: ____________ Movements: ____________ Start time: ____________ Finish time: ____________  °Date: ____________ Movements: ____________ Start time: ____________ Finish time: ____________ °Date: ____________ Movements: ____________ Start time: ____________ Finish time: ____________ °Date: ____________ Movements: ____________ Start time: ____________ Finish time: ____________ °Date: ____________ Movements: ____________ Start time: ____________ Finish time: ____________ °Date: ____________ Movements: ____________ Start time: ____________ Finish time: ____________ °Date: ____________ Movements: ____________ Start time: ____________ Finish time: ____________ °Date: ____________ Movements: ____________ Start time: ____________ Finish time: ____________  °Date: ____________ Movements: ____________ Start time: ____________ Finish time: ____________ °Date: ____________ Movements: ____________ Start time: ____________ Finish time: ____________ °Date: ____________ Movements: ____________ Start time: ____________ Finish time: ____________ °Date: ____________ Movements: ____________ Start time: ____________ Finish time: ____________ °Date: ____________ Movements: ____________ Start time: ____________ Finish time:  ____________ °Date: ____________ Movements: ____________ Start time: ____________ Finish time: ____________ °Date: ____________ Movements: ____________ Start time: ____________ Finish time: ____________  °Date: ____________ Movements: ____________ Start time: ____________ Finish time: ____________ °Date: ____________ Movements: ____________ Start time: ____________ Finish time: ____________ °Date: ____________ Movements: ____________ Start time: ____________ Finish time: ____________ °Date: ____________ Movements: ____________ Start time: ____________ Finish time: ____________ °Date: ____________ Movements: ____________ Start time: ____________ Finish time: ____________ °Date: ____________ Movements: ____________ Start time: ____________ Finish time: ____________ °  °This information is not intended to replace advice given to you by your health care provider. Make sure you discuss any questions you have with your health care provider. °  °Document Released: 02/04/2006 Document Revised: 01/26/2014 Document Reviewed: 11/02/2011 °Elsevier Interactive Patient Education ©2016 [employer] Inc[employer] ° °

## 2015-02-05 NOTE — Progress Notes (Signed)
Subjective:  Janice Moore is a 25 y.o. G2P1001 at [redacted]w[redacted]d being seen today for ongoing prenatal care.  She is currently monitored for the following issues for this low-risk pregnancy and has Late prenatal care affecting pregnancy in second trimester, antepartum; Supervision of normal pregnancy, antepartum; and Group B Streptococcus carrier, +RV culture, currently pregnant on her problem list.  Patient reports vaginal discharge & irritation. This has been an ongoing issue. Used Terazol cream last month with improvement. States provider was supposed to send in rx for cream again b/c yeast came back but pharmacy never got it.  Contractions: Irregular. Vag. Bleeding: None.  Movement: Present. Denies leaking of fluid.   The following portions of the patient's history were reviewed and updated as appropriate: allergies, current medications, past family history, past medical history, past social history, past surgical history and problem list. Problem list updated.  Objective:   Filed Vitals:   02/05/15 1558  BP: 125/74  Pulse: 76  Temp: 98.3 F (36.8 C)  Weight: 166 lb 6.4 oz (75.479 kg)    Fetal Status: Fetal Heart Rate (bpm): 146   Movement: Present     General:  Alert, oriented and cooperative. Patient is in no acute distress.  Skin: Skin is warm and dry. No rash noted.   Cardiovascular: Normal heart rate noted  Respiratory: Normal respiratory effort, no problems with respiration noted  Abdomen: Soft, gravid, appropriate for gestational age. Pain/Pressure: Present     Pelvic: Vag. Bleeding: None Vag D/C Character: White   Cervical exam deferred       Patient declines  Extremities: Normal range of motion.  Edema: Trace  Mental Status: Normal mood and affect. Normal behavior. Normal judgment and thought content.   Urinalysis: Urine Protein: Negative Urine Glucose: Negative  Assessment and Plan:  Pregnancy: G2P1001 at [redacted]w[redacted]d  1. Vaginal yeast infection  - fluconazole (DIFLUCAN)  150 MG tablet; Take 1 tablet (150 mg total) by mouth once.  Dispense: 1 tablet; Refill: 0  2. Supervision of normal pregnancy, antepartum, third trimester   Term labor symptoms and general obstetric precautions including but not limited to vaginal bleeding, contractions, leaking of fluid and fetal movement were reviewed in detail with the patient. Please refer to After Visit Summary for other counseling recommendations.  Return in about 1 week (around 02/12/2015).   Judeth Horn, NP

## 2015-02-05 NOTE — Progress Notes (Signed)
Large leukocytes noted on urinalysis.C/o still has yeast- thick white discharge, vaginal itching and burning, soreness. States prescription for cream not sent to pharmacy last week.  C/o dizziness sometimes.

## 2015-02-12 ENCOUNTER — Inpatient Hospital Stay (HOSPITAL_COMMUNITY)
Admission: AD | Admit: 2015-02-12 | Discharge: 2015-02-15 | DRG: 775 | Disposition: A | Discharge status: Home / Self Care | Payer: BLUE CROSS/BLUE SHIELD | Source: Ambulatory Visit | Attending: Obstetrics and Gynecology | Admitting: Obstetrics and Gynecology

## 2015-02-12 ENCOUNTER — Ambulatory Visit (INDEPENDENT_AMBULATORY_CARE_PROVIDER_SITE_OTHER): Payer: BLUE CROSS/BLUE SHIELD | Admitting: Family

## 2015-02-12 ENCOUNTER — Encounter (HOSPITAL_COMMUNITY): Payer: Self-pay | Admitting: *Deleted

## 2015-02-12 VITALS — BP 105/71 | HR 90 | Wt 168.1 lb

## 2015-02-12 DIAGNOSIS — O99824 Streptococcus B carrier state complicating childbirth: Secondary | ICD-10-CM | POA: Diagnosis present

## 2015-02-12 DIAGNOSIS — O0930 Supervision of pregnancy with insufficient antenatal care, unspecified trimester: Secondary | ICD-10-CM | POA: Diagnosis not present

## 2015-02-12 DIAGNOSIS — O480 Post-term pregnancy: Secondary | ICD-10-CM | POA: Diagnosis present

## 2015-02-12 DIAGNOSIS — O0932 Supervision of pregnancy with insufficient antenatal care, second trimester: Secondary | ICD-10-CM

## 2015-02-12 DIAGNOSIS — Z36 Encounter for antenatal screening of mother: Secondary | ICD-10-CM

## 2015-02-12 DIAGNOSIS — Z3A40 40 weeks gestation of pregnancy: Secondary | ICD-10-CM

## 2015-02-12 DIAGNOSIS — Z3483 Encounter for supervision of other normal pregnancy, third trimester: Secondary | ICD-10-CM

## 2015-02-12 DIAGNOSIS — Z833 Family history of diabetes mellitus: Secondary | ICD-10-CM

## 2015-02-12 DIAGNOSIS — O99820 Streptococcus B carrier state complicating pregnancy: Secondary | ICD-10-CM

## 2015-02-12 DIAGNOSIS — O4100X0 Oligohydramnios, unspecified trimester, not applicable or unspecified: Secondary | ICD-10-CM | POA: Diagnosis present

## 2015-02-12 DIAGNOSIS — O4103X0 Oligohydramnios, third trimester, not applicable or unspecified: Secondary | ICD-10-CM | POA: Diagnosis present

## 2015-02-12 LAB — POCT URINALYSIS DIP (DEVICE)
BILIRUBIN URINE: NEGATIVE
GLUCOSE, UA: NEGATIVE mg/dL
NITRITE: NEGATIVE
Protein, ur: NEGATIVE mg/dL
Specific Gravity, Urine: 1.025 (ref 1.005–1.030)
UROBILINOGEN UA: 0.2 mg/dL (ref 0.0–1.0)
pH: 7 (ref 5.0–8.0)

## 2015-02-12 LAB — CBC
HEMATOCRIT: 36.6 % (ref 36.0–46.0)
HEMOGLOBIN: 12.1 g/dL (ref 12.0–15.0)
MCH: 27.9 pg (ref 26.0–34.0)
MCHC: 33.1 g/dL (ref 30.0–36.0)
MCV: 84.5 fL (ref 78.0–100.0)
Platelets: 211 10*3/uL (ref 150–400)
RBC: 4.33 MIL/uL (ref 3.87–5.11)
RDW: 15 % (ref 11.5–15.5)
WBC: 9.7 10*3/uL (ref 4.0–10.5)

## 2015-02-12 LAB — OB RESULTS CONSOLE HIV ANTIBODY (ROUTINE TESTING): HIV: NONREACTIVE

## 2015-02-12 LAB — RAPID HIV SCREEN (HIV 1/2 AB+AG)
HIV 1/2 ANTIBODIES: NONREACTIVE
HIV-1 P24 ANTIGEN - HIV24: NONREACTIVE

## 2015-02-12 LAB — TYPE AND SCREEN
ABO/RH(D): O POS
ANTIBODY SCREEN: NEGATIVE

## 2015-02-12 LAB — ABO/RH: ABO/RH(D): O POS

## 2015-02-12 MED ORDER — TERBUTALINE SULFATE 1 MG/ML IJ SOLN
0.2500 mg | Freq: Once | INTRAMUSCULAR | Status: DC | PRN
  Filled 2015-02-12: qty 1

## 2015-02-12 MED ORDER — OXYCODONE-ACETAMINOPHEN 5-325 MG PO TABS
2.0000 | ORAL_TABLET | ORAL | Status: DC | PRN
Start: 2015-02-12 — End: 2015-02-13

## 2015-02-12 MED ORDER — LACTATED RINGERS IV SOLN: 500.0000 mL | INTRAVENOUS | Status: DC | PRN

## 2015-02-12 MED ORDER — PENICILLIN G POTASSIUM 5000000 UNITS IJ SOLR
2.5000 10*6.[IU] | INTRAVENOUS | Status: DC
  Filled 2015-02-12 (×2): qty 2.5

## 2015-02-12 MED ORDER — OXYTOCIN 10 UNIT/ML IJ SOLN
2.5000 [IU]/h | INTRAVENOUS | Status: DC
  Filled 2015-02-12: qty 4

## 2015-02-12 MED ORDER — PENICILLIN G POTASSIUM 5000000 UNITS IJ SOLR
5.0000 10*6.[IU] | Freq: Once | INTRAVENOUS | Status: AC
  Administered 2015-02-13: 5 10*6.[IU] via INTRAVENOUS
  Filled 2015-02-12: qty 5

## 2015-02-12 MED ORDER — OXYTOCIN BOLUS FROM INFUSION
500.0000 mL | INTRAVENOUS | Status: DC
  Administered 2015-02-13: 500 mL via INTRAVENOUS

## 2015-02-12 MED ORDER — LACTATED RINGERS IV SOLN
INTRAVENOUS | Status: DC
  Administered 2015-02-12 – 2015-02-13 (×2): via INTRAVENOUS

## 2015-02-12 MED ORDER — MISOPROSTOL 200 MCG PO TABS
50.0000 ug | ORAL_TABLET | ORAL | Status: DC | PRN
  Administered 2015-02-12 (×2): 50 ug via ORAL
  Filled 2015-02-12 (×2): qty 0.5

## 2015-02-12 MED ORDER — MISOPROSTOL 25 MCG QUARTER TABLET: 25.0000 ug | ORAL_TABLET | ORAL | Status: DC | PRN

## 2015-02-12 MED ORDER — OXYCODONE-ACETAMINOPHEN 5-325 MG PO TABS: 1.0000 | ORAL_TABLET | ORAL | Status: DC | PRN

## 2015-02-12 MED ORDER — ONDANSETRON HCL 4 MG/2ML IJ SOLN
4.0000 mg | Freq: Four times a day (QID) | INTRAMUSCULAR | Status: DC | PRN
Start: 2015-02-12 — End: 2015-02-13

## 2015-02-12 MED ORDER — CITRIC ACID-SODIUM CITRATE 334-500 MG/5ML PO SOLN: 30.0000 mL | ORAL | Status: DC | PRN

## 2015-02-12 MED ORDER — ACETAMINOPHEN 325 MG PO TABS: 650.0000 mg | ORAL_TABLET | ORAL | Status: DC | PRN

## 2015-02-12 MED ORDER — LIDOCAINE HCL (PF) 1 % IJ SOLN
30.0000 mL | INTRAMUSCULAR | Status: AC | PRN
  Administered 2015-02-13: 30 mL via SUBCUTANEOUS
  Filled 2015-02-12: qty 30

## 2015-02-12 NOTE — Progress Notes (Signed)
Report called to charge nurse in L&D.

## 2015-02-12 NOTE — H&P (Signed)
LABOR ADMISSION HISTORY AND PHYSICAL  Janice Moore is a 25 y.o. female G2P1001 with IUP at [redacted]w[redacted]d presenting for IOL for oligohydramnios.  Pt had reactive NST and BPP today, but low AFI 4.1.  Admitted to hospital for IOL.  She reports +FM, irregular contractions. No VB, LOF.  Denies blurry vision, headaches, peripheral edema, or RUQ pain.  She plans on breast feeding and vasectomy for birth control.  Dating: By LMP --->  Estimated Date of Delivery: 02/09/15  Sono:    , CWD, normal anatomy, cephalic presentation, 1862g, 16% EFW   Prenatal History/Complications: Post term pregnancy Late PNC Oligohydramnios - AFI 4.1 on 1/24 GBS positive   Past Medical History: Past Medical History  Diagnosis Date  . Medical history non-contributory     Past Surgical History: Past Surgical History  Procedure Laterality Date  . No past surgeries      Obstetrical History: OB History    Gravida Para Term Preterm AB TAB SAB Ectopic Multiple Living   Social History: Social History   Social History  . Marital Status: Married    Spouse Name: N/A  . Number of Children: N/A  . Years of Education: N/A   Social History Main Topics  . Smoking status: Never Smoker   . Smokeless tobacco: Never Used  . Alcohol Use: No  . Drug Use: No  . Sexual Activity: Yes    Birth Control/ Protection: Condom   Other Topics Concern  . None   Social History Narrative    Family History: Family History  Problem Relation Age of Onset  . Diabetes Mother   . Diabetes Maternal Grandfather   . Diabetes Paternal Grandfather     Allergies: No Known Allergies  Prescriptions prior to admission  Medication Sig Dispense Refill Last Dose  . Prenatal Vit-Fe Fumarate-FA (PRENATAL MULTIVITAMIN) TABS tablet Take 1 tablet by mouth daily at 12 noon.   02/11/2015 at Unknown time     Review of Systems   All systems reviewed and negative except as stated in HPI  BP 139/76 mmHg   Pulse 82  Ht  (1.626 m)  Wt 76.204 kg (168 lb)  BMI 28.82 kg/m2  LMP 05/05/2014 General appearance: alert, cooperative, appears stated age and no distress Lungs: clear to auscultation bilaterally Heart: regular rate and rhythm Abdomen: soft, non-tender; bowel sounds normal Pelvic: adequate, cervical exam in office: fingertip Extremities: Homans sign is negative, no sign of DVT, edema DTR's nl Presentation: cephalic on bedside u/s Fetal monitoringBaseline: 120 bpm, Variability: Fair (1-6 bpm) and Accelerations: Reactive Uterine activityFrequency: Every 4 minutes     Prenatal labs: ABO, Rh: O/POS/-- (10/10 1415) Antibody: NEG (10/10 1415) Rubella: Iimmune RPR: NON REAC (10/10 1415)  HBsAg: NEGATIVE (10/10 1415)  HIV:   unknown, pending GBS: Positive (12/27 0000)  1 hr Glucola 80 Genetic screening  Too late Anatomy US: initial L renal dilation resolved  Prenatal Transfer Tool  Maternal Diabetes: No Genetic Screening: too late Maternal Ultrasounds/Referrals: Normal Fetal Ultrasounds or other Referrals:  None Maternal Substance Abuse:  No Significant Maternal Medications:  None Significant Maternal Lab Results: GBS pos, HIV unknown  Results for orders placed or performed during the hospital encounter of 02/12/15 (from the past 24 hour(s))  CBC   Collection Time: 02/12/15  5:58 PM  Result Value Ref Range   WBC 9.7 4.0 - 10.5 K/uL   RBC 4.33 3.87 - 5.11  MIL/uL   Hemoglobin 12.1 12.0 - 15.0 g/dL   HCT 16.1 09.6 - 04.5 %   MCV 84.5 78.0 - 100.0 fL   MCH 27.9 26.0 - 34.0 pg   MCHC 33.1 30.0 - 36.0 g/dL   RDW 40.9 81.1 - 91.4 %   Platelets 211 150 - 400 K/uL  Results for orders placed or performed in visit on 02/12/15 (from the past 24 hour(s))  POCT urinalysis dip (device)   Collection Time: 02/12/15  3:30 PM  Result Value Ref Range   Glucose, UA NEGATIVE NEGATIVE mg/dL   Bilirubin Urine NEGATIVE NEGATIVE   Ketones, ur TRACE (A) NEGATIVE mg/dL   Specific Gravity,  Urine 1.025 1.005 - 1.030   Hgb urine dipstick TRACE (A) NEGATIVE   pH 7.0 5.0 - 8.0   Protein, ur NEGATIVE NEGATIVE mg/dL   Urobilinogen, UA 0.2 0.0 - 1.0 mg/dL   Nitrite NEGATIVE NEGATIVE   Leukocytes, UA SMALL (A) NEGATIVE    Patient Active Problem List   Diagnosis Date Noted  . Oligohydramnios antepartum 02/12/2015  . Supervision of normal pregnancy, antepartum 01/22/2015  . Group B Streptococcus carrier, +RV culture, currently pregnant 01/22/2015  . Late prenatal care affecting pregnancy in second trimester, antepartum 10/29/2014    Assessment: Janice Moore is a 25 y.o. G2P1001 at [redacted]w[redacted]d here for IOL for oligohydramnios.   # IOL: # Oligohydramnios: #Post dates:  -admit to L&D with routine orders  -will begin with PO cytotec, may require foley bulb if no progression   #FWB: Category 2 -Continue monitoring   # Pain Control:  -analgesia prn  # GBS: positive -PCN  # Postpartum plan:  -Feeding: breast -Contraception: vasectomy -Circ: yes   Wynne Dust, MD, PGY-1  OB fellow attestation: I have seen and examined this patient; I agree with above documentation in the resident's note.   Janice Moore is a 25 y.o. G2P1001 here for IOL for oligohydramnios  PE: BP 139/76 mmHg  Pulse 82  Ht  (1.626 m)  Wt 168 lb (76.204 kg)  BMI 28.82 kg/m2  LMP 05/05/2014 Gen: calm comfortable, NAD Resp: normal effort, no distress Abd: gravid  ROS, labs, PMH reviewed  Plan: Admit to LD for IOL 2/2 to oligo Labor: plan for cytotec PO. Discussed possible FB and pitocin.  FWB: cat I, after cytotec eligible for intermittent monitoring.  GBS: pos, start PCN in active labor MOF: breast MOC: vasectomy Circ: desired, father to check on insurance coverage of circumcision. If not covered then plan to give outpatient resources.   Diet: regular, patient's husband will bring in halal food  Federico Flake, MD Family Medicine, OB Fellow 02/12/2015, 7:11  PM

## 2015-02-12 NOTE — Progress Notes (Signed)
Labor Progress Note  Janice Moore is a 25 y.o. G2P1001 at [redacted]w[redacted]d  admitted for induction of labor due to Oligohydramnios.  S:  Patient doing well. States contractions are about 7/10 on pain scale. Patient is unsure if she would like pain medications during labor but is currently declines pain medications. We discussed options and asked to let the treatment team know about patient's decision.   O:  BP 121/83 mmHg  Pulse 82  Temp(Src) 97.9 F (36.6 C)  Resp 18  Ht  (1.626 m)  Wt 76.204 kg (168 lb)  BMI 28.82 kg/m2  LMP 05/05/2014     FHT:  FHR: 145-150 bpm, variability: minimal to moderate,  accelerations:  Abscent,  decelerations:  Absent UC:   Every 1-2 mins SVE:   Dilation: Fingertip Effacement (%): Thick Exam by:: dr Alvester Morin Intact Membranes  Labs: Lab Results  Component Value Date   WBC 9.7 02/12/2015   HGB 12.1 02/12/2015   HCT 36.6 02/12/2015   MCV 84.5 02/12/2015   PLT 211 02/12/2015    Assessment / Plan: 25 y.o. G2P1001 [redacted]w[redacted]d IOL for oligohydramnios  Induction of labor due to oligohydramnios, s/p cytotec (PO) x 1  Labor: s/p cytotec (PO) x 1 Fetal Wellbeing:  Category I/Category II: minimal to moderate variability Pain Control:  currently declines pan medications; is unsure if she would like to use pain medication; discussed options Anticipated MOD:  NSVD  Palma Holter, MD PGY 1 Family Medicine

## 2015-02-12 NOTE — Progress Notes (Signed)
Subjective:  Janice Moore is a 25 y.o. G2P1001 at [redacted]w[redacted]d being seen today for ongoing prenatal care.  She is currently monitored for the following issues for this low-risk pregnancy and has late prenatal care affecting pregnancy, antepartum; Supervision of normal pregnancy, antepartum; and Group B Streptococcus carrier, +RV culture, currently pregnant on her problem list.  Patient reports no complaints.  Contractions: Irregular. Vag. Bleeding: None.  Movement: Present. Denies leaking of fluid.   The following portions of the patient's history were reviewed and updated as appropriate: allergies, current medications, past family history, past medical history, past social history, past surgical history and problem list. Problem list updated.  Objective:   Filed Vitals:   02/12/15 1536  BP: 105/71  Pulse: 90  Weight: 168 lb 1.6 oz (76.25 kg)    Fetal Status: Fetal Heart Rate (bpm): NST-R Fundal Height: 40 cm Movement: Present  Presentation: Vertex  General:  Alert, oriented and cooperative. Patient is in no acute distress.  Skin: Skin is warm and dry. No rash noted.   Cardiovascular: Normal heart rate noted  Respiratory: Normal respiratory effort, no problems with respiration noted  Abdomen: Soft, gravid, appropriate for gestational age. Pain/Pressure: Present     Pelvic: Vag. Bleeding: None     Cervical exam performed Dilation: Fingertip      Extremities: Normal range of motion.  Edema: Trace  Mental Status: Normal mood and affect. Normal behavior. Normal judgment and thought content.   Urinalysis: Urine Protein: Negative Urine Glucose: Negative  AFI 4.1 per Diane Day Assessment and Plan:  Pregnancy: G2P1001 at [redacted]w[redacted]d  1. Post term pregnancy over 40 weeks - Amniotic fluid index with NST; 4.1 - NST reactive - Admitted for IOL  2. Late prenatal care affecting pregnancy   Walidah N Karim, CNM

## 2015-02-13 ENCOUNTER — Encounter (HOSPITAL_COMMUNITY): Payer: Self-pay | Admitting: Emergency Medicine

## 2015-02-13 DIAGNOSIS — Z3A40 40 weeks gestation of pregnancy: Secondary | ICD-10-CM

## 2015-02-13 DIAGNOSIS — O99824 Streptococcus B carrier state complicating childbirth: Secondary | ICD-10-CM

## 2015-02-13 LAB — RPR: RPR: NONREACTIVE

## 2015-02-13 MED ORDER — IBUPROFEN 600 MG PO TABS
600.0000 mg | ORAL_TABLET | Freq: Four times a day (QID) | ORAL | Status: DC
  Administered 2015-02-13 – 2015-02-15 (×10): 600 mg via ORAL
  Filled 2015-02-13 (×10): qty 1

## 2015-02-13 MED ORDER — WITCH HAZEL-GLYCERIN EX PADS: 1.0000 "application " | MEDICATED_PAD | CUTANEOUS | Status: DC | PRN

## 2015-02-13 MED ORDER — SIMETHICONE 80 MG PO CHEW: 80.0000 mg | CHEWABLE_TABLET | ORAL | Status: DC | PRN

## 2015-02-13 MED ORDER — ONDANSETRON HCL 4 MG/2ML IJ SOLN: 4.0000 mg | INTRAMUSCULAR | Status: DC | PRN

## 2015-02-13 MED ORDER — FENTANYL CITRATE (PF) 100 MCG/2ML IJ SOLN
100.0000 ug | Freq: Once | INTRAMUSCULAR | Status: AC
  Administered 2015-02-13: 100 ug via INTRAVENOUS

## 2015-02-13 MED ORDER — BENZOCAINE-MENTHOL 20-0.5 % EX AERO
1.0000 "application " | INHALATION_SPRAY | CUTANEOUS | Status: DC | PRN
  Filled 2015-02-13: qty 56

## 2015-02-13 MED ORDER — LANOLIN HYDROUS EX OINT: TOPICAL_OINTMENT | CUTANEOUS | Status: DC | PRN

## 2015-02-13 MED ORDER — ZOLPIDEM TARTRATE 5 MG PO TABS: 5.0000 mg | ORAL_TABLET | Freq: Every evening | ORAL | Status: DC | PRN

## 2015-02-13 MED ORDER — PENICILLIN G POTASSIUM 5000000 UNITS IJ SOLR
2.5000 10*6.[IU] | INTRAVENOUS | Status: DC
  Filled 2015-02-13 (×3): qty 2.5

## 2015-02-13 MED ORDER — PRENATAL MULTIVITAMIN CH
1.0000 | ORAL_TABLET | Freq: Every day | ORAL | Status: DC
  Administered 2015-02-13 – 2015-02-15 (×3): 1 via ORAL
  Filled 2015-02-13 (×3): qty 1

## 2015-02-13 MED ORDER — DIBUCAINE 1 % RE OINT
1.0000 | TOPICAL_OINTMENT | RECTAL | Status: DC | PRN
Start: 2015-02-13 — End: 2015-02-15

## 2015-02-13 MED ORDER — DIPHENHYDRAMINE HCL 25 MG PO CAPS: 25.0000 mg | ORAL_CAPSULE | Freq: Four times a day (QID) | ORAL | Status: DC | PRN

## 2015-02-13 MED ORDER — TETANUS-DIPHTH-ACELL PERTUSSIS 5-2.5-18.5 LF-MCG/0.5 IM SUSP: 0.5000 mL | Freq: Once | INTRAMUSCULAR | Status: DC

## 2015-02-13 MED ORDER — ACETAMINOPHEN 325 MG PO TABS
650.0000 mg | ORAL_TABLET | ORAL | Status: DC | PRN
  Administered 2015-02-13 (×3): 650 mg via ORAL
  Filled 2015-02-13 (×4): qty 2

## 2015-02-13 MED ORDER — SENNOSIDES-DOCUSATE SODIUM 8.6-50 MG PO TABS
2.0000 | ORAL_TABLET | ORAL | Status: DC
  Administered 2015-02-13 – 2015-02-15 (×2): 2 via ORAL
  Filled 2015-02-13 (×2): qty 2

## 2015-02-13 MED ORDER — FENTANYL CITRATE (PF) 100 MCG/2ML IJ SOLN
INTRAMUSCULAR | Status: AC
  Administered 2015-02-13: 100 ug via INTRAVENOUS
  Filled 2015-02-13: qty 2

## 2015-02-13 MED ORDER — ONDANSETRON HCL 4 MG PO TABS: 4.0000 mg | ORAL_TABLET | ORAL | Status: DC | PRN

## 2015-02-13 NOTE — Lactation Note (Signed)
This note was copied from the chart of Janice Henley Icenhour. Lactation Consultation Note  Patient Name: Janice Moore Date: 02/13/2015 Reason for consult: Initial assessment  Visited with Mom, baby 68 hrs old.  Mom states baby has been latching well but she is sore on her right side. (Mom stated she used a nipple shield with her first baby) Nipple slightly abraded and a reddened bruise noted on her areola at 11 o'clock.  Nipples erect, and areola compressible, so recommended she improve baby's latch rather than use a nipple shield. Manual breast expression taught and encouraged to apply to nipple post feedings.  Reviewed latching with Mom, and baby started cueing.  Offered assistance and set baby up to feed on the right breast in football hold.  Mom using a scissor hold, and pulling her breast away from baby's mouth.  Took baby off, and helped latch while teaching Mom hand placement.  Baby latched after a couple attempts.  Mom denied feeling discomfort and swallowing heard.  Encourage Mom to adjust baby's head angle, rather than pulling her breast away from his nose.  Encouraged continued skin to skin, and cue based feedings. Brochure left with Mom, and informed her of IP and OP Lactation services available to her.  Follow up in am and prn as needed. Consult Status Consult Status: Follow-up Date: 02/14/15 Follow-up type: In-patient    Janice Moore 02/13/2015, 1:13 PM

## 2015-02-14 NOTE — Progress Notes (Signed)
Post Partum Day 1 Subjective: up ad lib, voiding, tolerating PO, + flatus and no complaints  Objective: Blood pressure 114/64, pulse 86, temperature 98 F (36.7 C), temperature source Oral, resp. rate 18, height  (1.626 m), weight 76.204 kg (168 lb), last menstrual period 05/05/2014, SpO2 99 %, unknown if currently breastfeeding.  Physical Exam:  General: alert, cooperative and no distress Lochia: appropriate Uterine Fundus: firm DVT Evaluation: No evidence of DVT seen on physical exam. No cords or calf tenderness.   Recent Labs  02/12/15 1758  HGB 12.1  HCT 36.6    Assessment/Plan: Plan for discharge tomorrow  Continue current management.  FOB is planning to call insurance today for questions re: circumcision.  FOB will notify if inpatient circ desired. List of outpatient facilities offering service provided.  Contraception discussion; planning for vasectomy.    LOS: 2 days   Janice Moore 02/14/2015, 7:37 AM   CNM attestation Post Partum Day #1   Janice Moore is a 25 y.o. [id number] s/p SVD.  Pt denies problems with ambulating, voiding or po intake. Pain is well controlled.  Plan for birth control is vasectomy.  Method of Feeding: breast  PE:  BP 114/64 mmHg  Pulse 86  Temp(Src) 98 F (36.7 C) (Oral)  Resp 18  Ht  (1.626 m)  Wt 76.204 kg (168 lb)  BMI 28.82 kg/m2  SpO2 99%  LMP 05/05/2014  Breastfeeding? Unknown Fundus firm  Plan for discharge: 02/15/15  Cam Hai, CNM 9:39 AM  02/14/2015

## 2015-02-14 NOTE — Lactation Note (Signed)
This note was copied from the chart of Janice Moore. Lactation Consultation Note  Patient Name: Janice Moore [id number] Date: 02/14/2015 Reason for consult: Follow-up assessment Baby at 37 hr of life and mom is reporting sore nipples. The R nipple is red the L nipple is red with 2 tiny blisters on the tip. Mom is wearing comfort gels. Encouraged her to use her milk after each feeding. Answered questions about lanolin and Rx nipple cream. She thinks that the NS is helping the soreness get better. Discussed baby behavior, pacifiers, feeding frequency, pumping, baby belly size, voids, wt loss, breast changes, and nipple care. Mom is aware of OP services and support group.     Maternal Data    Feeding Feeding Type: Breast Fed Length of feed: 15 min  LATCH Score/Interventions                      Lactation Tools Discussed/Used     Consult Status Consult Status: Follow-up Date: 02/15/15 Follow-up type: In-patient    Rulon Eisenmenger 02/14/2015, 5:31 PM

## 2015-02-15 MED ORDER — IBUPROFEN 600 MG PO TABS: 600.0000 mg | ORAL_TABLET | Freq: Four times a day (QID) | ORAL | Status: DC | PRN

## 2015-02-15 NOTE — Discharge Summary (Signed)
OB Discharge Summary     Patient Name: Janice Moore DOB: 1990/12/04 MRN: [id number]  Date of admission: 02/12/2015 Delivering MD: Catalina Antigua   Date of discharge: 02/15/2015  Admitting diagnosis: direct admit 40w Intrauterine pregnancy: [redacted]w[redacted]d     Secondary diagnosis:  Active Problems:   Oligohydramnios antepartum  Additional problems: none     Discharge diagnosis: Term Pregnancy Delivered                                                                                                Post partum procedures:none  Augmentation: Cytotec  Complications: None  Hospital course:  Induction of Labor With Vaginal Delivery   25 y.o. yo [id number] at [redacted]w[redacted]d was admitted to the hospital 02/12/2015 for induction of labor.  Indication for induction: Oligohydramnios.  Patient had an uncomplicated labor course as follows: Membrane Rupture Time/Date: 1:49 AM ,02/13/2015   Intrapartum Procedures: Episiotomy: None [1]                                         Lacerations:  1st degree [2]  Patient had delivery of a Viable infant.  Information for the patient's newborn:  Kourtney, Terriquez [[id number]]  Delivery Method: Vaginal, Spontaneous Delivery (Filed from Delivery Summary)   02/13/2015  Details of delivery can be found in separate delivery note.  Patient had a routine postpartum course. Patient is discharged home 02/15/2015.   Physical exam  Filed Vitals:   02/13/15 1549 02/14/15 0640 02/14/15 1758 02/15/15 0538  BP: 117/71 114/64 121/71 104/62  Pulse: 83 86 83 67  Temp: 97.5 F (36.4 C) 98 F (36.7 C) 98.3 F (36.8 C) 97.8 F (36.6 C)  TempSrc: Oral Oral Oral Oral  Resp: Height:      Weight:      SpO2: 99%  98%    General: alert and cooperative Lochia: appropriate Uterine Fundus: firm Incision: N/A DVT Evaluation: No evidence of DVT seen on physical exam. Labs: Lab Results  Component Value Date   WBC 9.7 02/12/2015   HGB 12.1 02/12/2015   HCT 36.6  02/12/2015   MCV 84.5 02/12/2015   PLT 211 02/12/2015   No flowsheet data found.  Discharge instruction: per After Visit Summary and "Baby and Me Booklet".  After visit meds:    Medication List    TAKE these medications        ibuprofen 600 MG tablet  Commonly known as:  ADVIL,MOTRIN  Take 1 tablet (600 mg total) by mouth every 6 (six) hours as needed.     prenatal multivitamin Tabs tablet  Take 1 tablet by mouth daily at 12 noon.        Diet: routine diet  Activity: Advance as tolerated. Pelvic rest for 6 weeks.   Outpatient follow up:6 weeks Follow up Appt:Future Appointments Date Time Provider Department Center  03/20/2015 2:45 PM Federico Flake, MD [facility]   Follow up Visit:No Follow-up on file.  Postpartum contraception: husband  plans vasectomy; pt declines contraception in the meantime  Newborn Data: Live born female  Birth Weight: 8 lb 5.2 oz (3775 g) APGAR: 9, 9  Baby Feeding: Breast Disposition:home with mother; infant w/ circ prior to d/c   02/15/2015 Cam Hai, CNM  9:58 AM

## 2015-02-15 NOTE — Discharge Instructions (Signed)

## 2015-03-20 ENCOUNTER — Encounter: Payer: Self-pay | Admitting: Family

## 2015-03-20 ENCOUNTER — Ambulatory Visit (INDEPENDENT_AMBULATORY_CARE_PROVIDER_SITE_OTHER): Payer: BLUE CROSS/BLUE SHIELD | Admitting: Family Medicine

## 2015-03-20 ENCOUNTER — Encounter: Payer: Self-pay | Admitting: Family Medicine

## 2015-03-20 DIAGNOSIS — O0932 Supervision of pregnancy with insufficient antenatal care, second trimester: Secondary | ICD-10-CM

## 2015-03-20 DIAGNOSIS — O99820 Streptococcus B carrier state complicating pregnancy: Secondary | ICD-10-CM

## 2015-03-20 DIAGNOSIS — B373 Candidiasis of vulva and vagina: Secondary | ICD-10-CM

## 2015-03-20 DIAGNOSIS — Z3480 Encounter for supervision of other normal pregnancy, unspecified trimester: Secondary | ICD-10-CM

## 2015-03-20 DIAGNOSIS — B372 Candidiasis of skin and nail: Secondary | ICD-10-CM

## 2015-03-20 MED ORDER — FLUCONAZOLE 150 MG PO TABS: 150.0000 mg | ORAL_TABLET | Freq: Once | ORAL | Status: AC

## 2015-03-20 NOTE — Progress Notes (Signed)
Patient ID: Janice Moore, female   DOB: 11/02/1990, 25 y.o.   MRN: [id number] Subjective:     Janice Moore is a 25 y.o. female who presents for a postpartum visit. She is 5 weeks postpartum following a spontaneous vaginal delivery. I have fully reviewed the prenatal and intrapartum course. The delivery was at 40.4 gestational weeks. Outcome: spontaneous vaginal delivery. Anesthesia: none. Postpartum course has been unremarkable. Baby's course has been unremarkable. Baby is feeding by breast. Bleeding staining only. Bowel function is normal. Bladder function is normal. Patient is not sexually active. Contraception method is condoms. Postpartum depression screening: negative.  Reports vaginal pain, hurts "so much" and reports itching.   The following portions of the patient's history were reviewed and updated as appropriate: allergies, current medications, past family history, past medical history, past social history, past surgical history and problem list.  Review of Systems Pertinent items are noted in HPI.   Objective:    BP 105/68 mmHg  Pulse 68  Temp(Src) 98 F (36.7 C) (Oral)  Ht  (1.626 m)  Wt 146 lb (66.225 kg)  BMI 25.05 kg/m2  Breastfeeding? Yes  General:  alert, cooperative and appears stated age   Breasts:  inspection negative, no nipple discharge or bleeding, no masses or nodularity palpable  Lungs: Normal WOB  Heart:  RR  Abdomen: soft, non-tender; bowel sounds normal; no masses,  no organomegaly   Vulva:  positive for erythema bilaterally- raised lesions with satellite lesions.   Vagina: normal vagina, no discharge, exudate, lesion, or erythema. Well healed 1st degree laceration- no evidence of breakdown  Cervix:  not evaluated  Corpus: not examined  Adnexa:  not evaluated  Rectal Exam: Not performed.        Assessment:   Normal postpartum exam. Pap smear not done at today's visit.   Plan:    1. Contraception: condoms 2. Cutaneous candidiasis:  diflucan ordered 3. Vaginal pain: likely due to above. OB laceration is well healed.  4. Mood is normal, no current concerns for PPD 5. Follow up in:  as needed.

## 2015-03-20 NOTE — Patient Instructions (Signed)
Postpartum Depression and Baby Blues °The postpartum period begins right after the birth of a baby. During this time, there is often a great amount of joy and excitement. It is also a time of many changes in the life of the parents. Regardless of how many times a mother gives birth, each child brings new challenges and dynamics to the family. It is not unusual to have feelings of excitement along with confusing shifts in moods, emotions, and thoughts. All mothers are at risk of developing postpartum depression or the "baby blues." These mood changes can occur right after giving birth, or they may occur many months after giving birth. The baby blues or postpartum depression can be mild or severe. Additionally, postpartum depression can go away rather quickly, or it can be a long-term condition.  °CAUSES °Raised hormone levels and the rapid drop in those levels are thought to be a main cause of postpartum depression and the baby blues. A number of hormones change during and after pregnancy. Estrogen and progesterone usually decrease right after the delivery of your baby. The levels of thyroid hormone and various cortisol steroids also rapidly drop. Other factors that play a role in these mood changes include major life events and genetics.  °RISK FACTORS °If you have any of the following risks for the baby blues or postpartum depression, know what symptoms to watch out for during the postpartum period. Risk factors that may increase the likelihood of getting the baby blues or postpartum depression include: °· Having a personal or family history of depression.   °· Having depression while being pregnant.   °· Having premenstrual mood issues or mood issues related to oral contraceptives. °· Having a lot of life stress.   °· Having marital conflict.   °· Lacking a social support network.   °· Having a baby with special needs.   °· Having health problems, such as diabetes.   °SIGNS AND SYMPTOMS °Symptoms of baby blues  include: °· Brief changes in mood, such as going from extreme happiness to sadness. °· Decreased concentration.   °· Difficulty sleeping.   °· Crying spells, tearfulness.   °· Irritability.   °· Anxiety.   °Symptoms of postpartum depression typically begin within the first month after giving birth. These symptoms include: °· Difficulty sleeping or excessive sleepiness.   °· Marked weight loss.   °· Agitation.   °· Feelings of worthlessness.   °· Lack of interest in activity or food.   °Postpartum psychosis is a very serious condition and can be dangerous. Fortunately, it is rare. Displaying any of the following symptoms is cause for immediate medical attention. Symptoms of postpartum psychosis include:  °· Hallucinations and delusions.   °· Bizarre or disorganized behavior.   °· Confusion or disorientation.   °DIAGNOSIS  °A diagnosis is made by an evaluation of your symptoms. There are no medical or lab tests that lead to a diagnosis, but there are various questionnaires that a health care provider may use to identify those with the baby blues, postpartum depression, or psychosis. Often, a screening tool called the Edinburgh Postnatal Depression Scale is used to diagnose depression in the postpartum period.  °TREATMENT °The baby blues usually goes away on its own in 1-2 weeks. Social support is often all that is needed. You will be encouraged to get adequate sleep and rest. Occasionally, you may be given medicines to help you sleep.  °Postpartum depression requires treatment because it can last several months or longer if it is not treated. Treatment may include individual or group therapy, medicine, or both to address any social, physiological, and psychological   factors that may play a role in the depression. Regular exercise, a healthy diet, rest, and social support may also be strongly recommended.  °Postpartum psychosis is more serious and needs treatment right away. Hospitalization is often needed. °HOME CARE  INSTRUCTIONS °· Get as much rest as you can. Nap when the baby sleeps.   °· Exercise regularly. Some women find yoga and walking to be beneficial.   °· Eat a balanced and nourishing diet.   °· Do little things that you enjoy. Have a cup of tea, take a bubble bath, read your favorite magazine, or listen to your favorite music. °· Avoid alcohol.   °· Ask for help with household chores, cooking, grocery shopping, or running errands as needed. Do not try to do everything.   °· Talk to people close to you about how you are feeling. Get support from your partner, family members, friends, or other new moms. °· Try to stay positive in how you think. Think about the things you are grateful for.   °· Do not spend a lot of time alone.   °· Only take over-the-counter or prescription medicine as directed by your health care provider. °· Keep all your postpartum appointments.   °· Let your health care provider know if you have any concerns.   °SEEK MEDICAL CARE IF: °You are having a reaction to or problems with your medicine. °SEEK IMMEDIATE MEDICAL CARE IF: °· You have suicidal feelings.   °· You think you may harm the baby or someone else. °MAKE SURE YOU: °· Understand these instructions. °· Will watch your condition. °· Will get help right away if you are not doing well or get worse. °  °This information is not intended to replace advice given to you by your health care provider. Make sure you discuss any questions you have with your health care provider. °  °Document Released: 10/10/2003 Document Revised: 01/10/2013 Document Reviewed: 10/17/2012 °Elsevier Interactive Patient Education ©2016 [employer] ° °

## 2017-05-30 IMAGING — US US MFM OB DETAIL+14 WK
1 series · 14 of 28 positions shown · non-contrast
Comparison: none

[Series 1: us mfm ob detail+14 wk · 75 acquisitions, 14 frames shown]
[im 3/75]
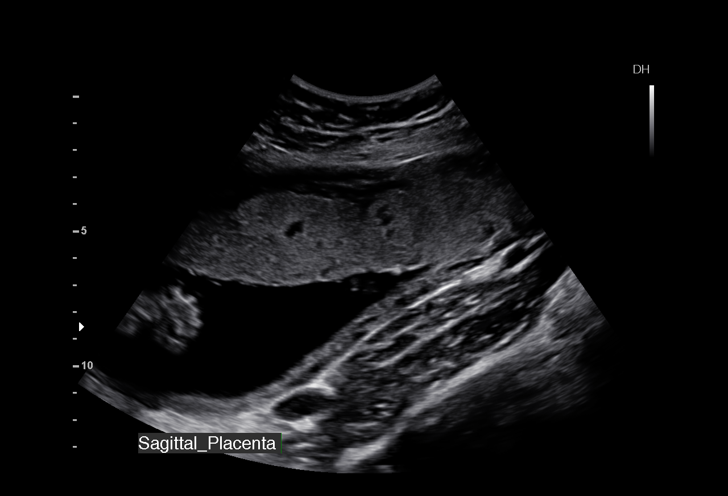
[im 9/75]
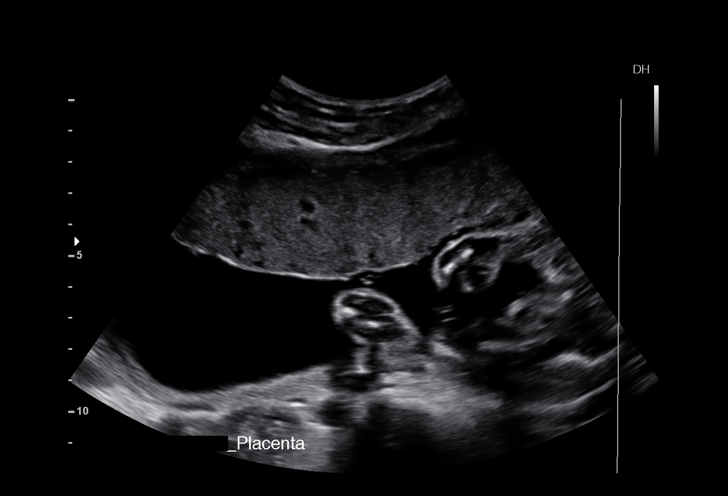
[im 14/75]
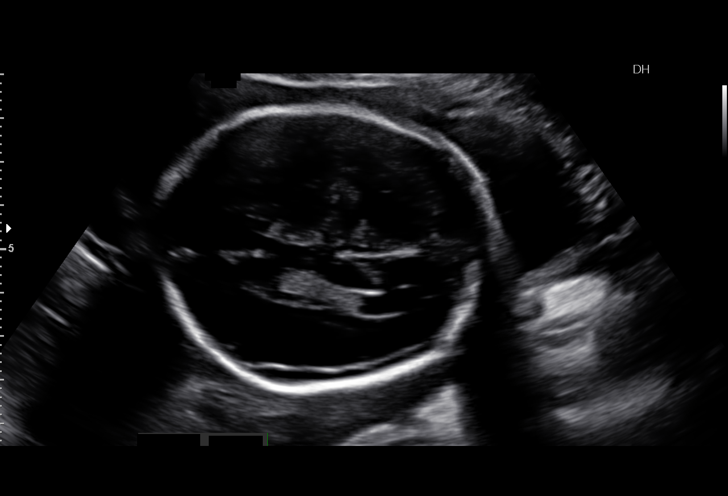
[im 20/75]
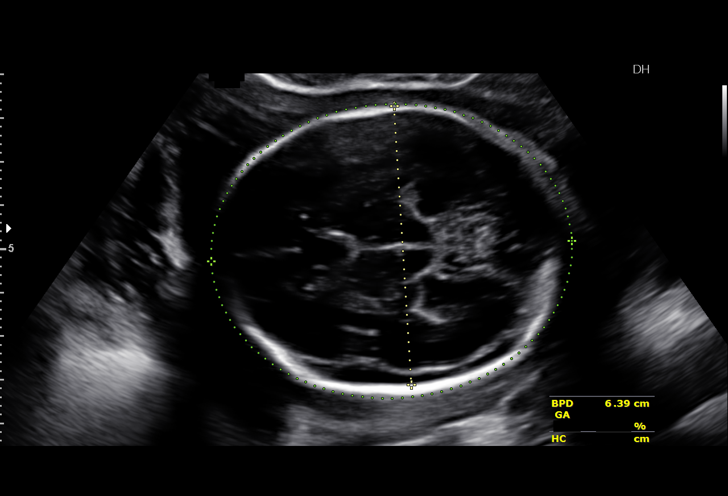
[im 25/75]
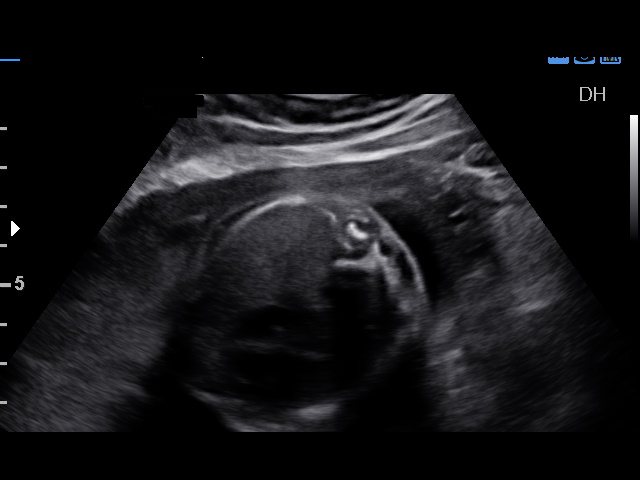
[im 31/75]
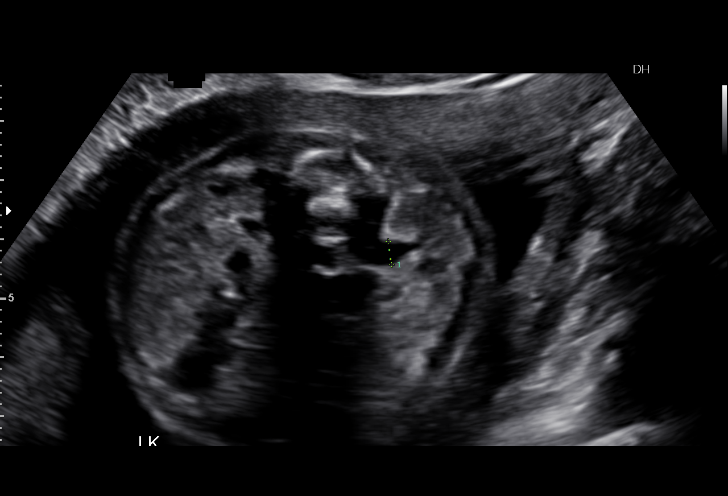
[im 36/75]
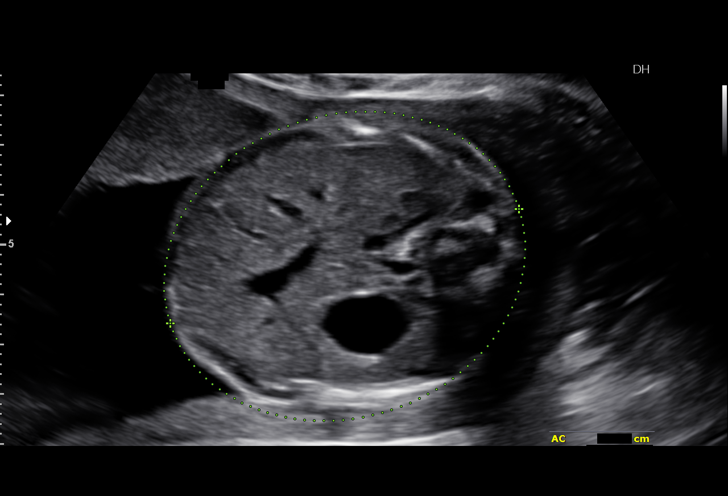
[im 42/75]
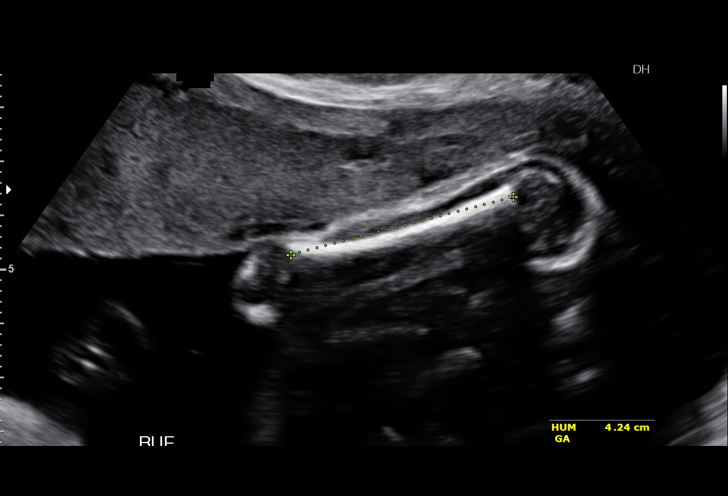
[im 47/75]
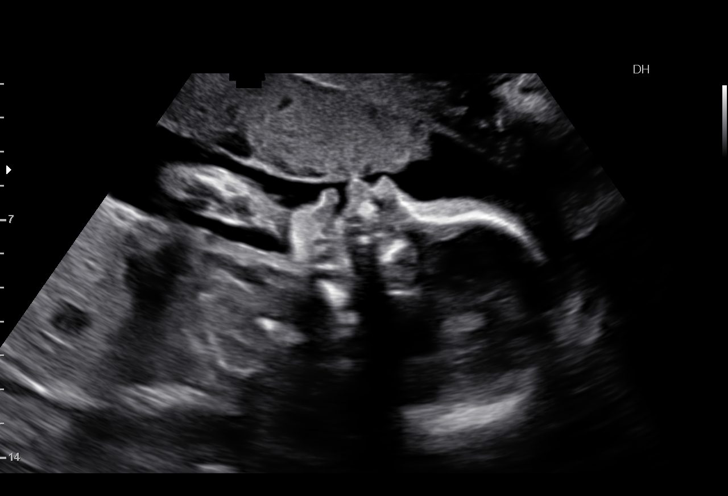
[im 53/75]
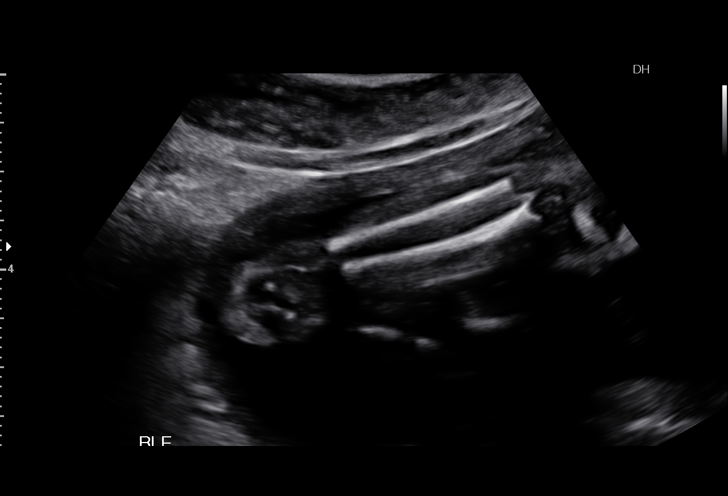
[im 58/75]
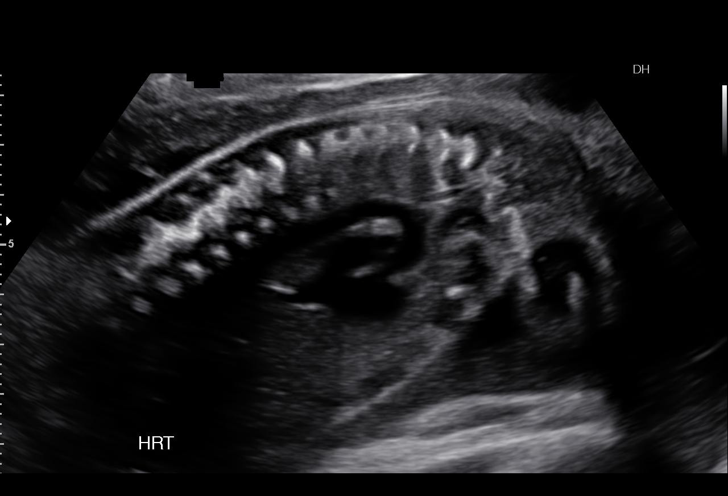
[im 64/75]
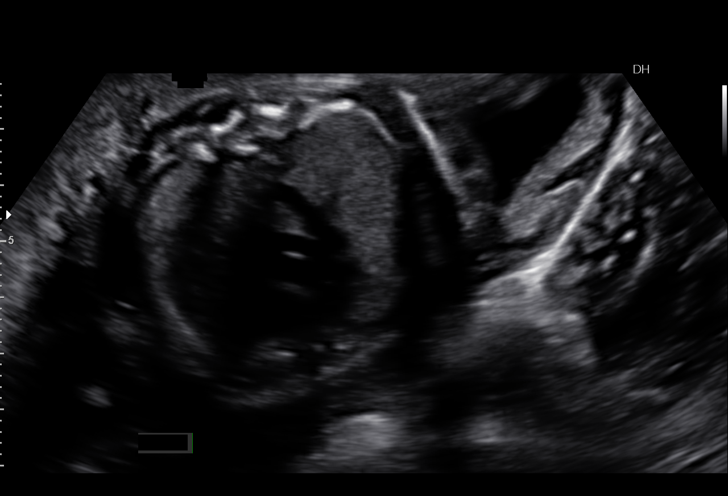
[im 69/75]
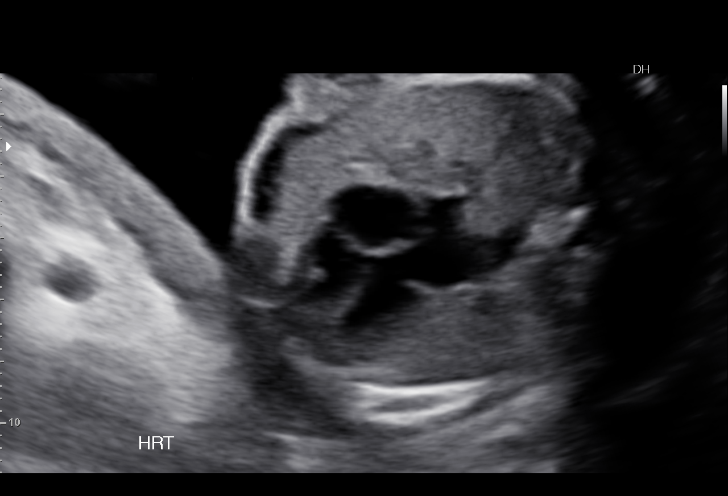
[im 75/75]
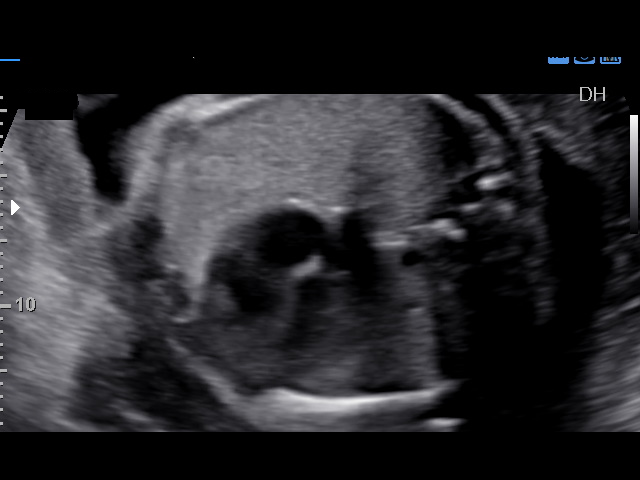

[14 of 28 positions shown; findings below may reference images not displayed]

OBSTETRICS REPORT
(Signed Final 11/02/2014 [DATE])

Name:       MARITZA KNEPPER                        Visit  11/02/2014 [DATE]
SHEETS                                Date:

Service(s) Provided

Indications

25 weeks gestation of pregnancy
Detailed fetal anatomic survey                         Z36
Insufficient Prenatal Care
Fetal Evaluation

Num Of             1
Fetuses:
Fetal Heart        159                          bpm
Rate:
Cardiac Activity:  Observed
Presentation:      Cephalic
Placenta:          Anterior, above cervical
os
P. Cord            Visualized, central
Insertion:

Amniotic Fluid
AFI FV:      Subjectively within normal limits
Larg Pckt:      5.3  cm
Biometry

BPD:     63.8   m    G. Age:   25w 6d                 CI:        73.42   70 - 86
m
FL/HC:      19.7   18.6 -
20.4
HC:     236.6   m    G. Age:   25w 5d        22  %    HC/AC:      1.11   1.04 -
m
AC:     213.2   m    G. Age:   25w 6d        41  %    FL/BPD      73.0   71 - 87
m                                     :
FL:      46.6   m    G. Age:   25w 4d        26  %    FL/AC:      21.9   20 - 24
m
HUM:     42.4   m    G. Age:   25w 3d        35  %
m
CER:     28.6   m    G. Age:   25w 4d        46  %
m
Est.         842   gm   1 lb 14 oz      51   %
FW:
Gestational Age

LMP:           25w 6d        Date:  05/05/14                  EDD:   02/09/15
U/S Today:     25w 5d                                         EDD:   02/10/15
Best:          25w 6d    Det. By:   LMP  (05/05/14)           EDD:   02/09/15
Anatomy

Cranium:          Appears normal         Aortic Arch:       Appears normal
Fetal Cavum:      Appears normal         Ductal Arch:       Appears normal
Ventricles:       Appears normal         Diaphragm:         Appears normal
Choroid Plexus:   Appears normal         Stomach:           Appears normal,
left sided
Cerebellum:       Appears normal         Abdomen:           Appears normal
Posterior         Appears normal         Abdominal          Appears nml (cord
Fossa:                                   Wall:              insert, abd wall)
Nuchal Fold:      Not applicable (>20    Cord Vessels:      Appears normal (3
wks GA)                                   vessel cord)
Face:             Appears normal         Kidneys:           Left renal UTD
(orbits and profile)
Lips:             Appears normal         Bladder:           Appears normal
Heart:            Appears normal         Spine:             Appears normal
(4CH, axis, and
situs)
RVOT:             Appears normal         Lower              Appears normal
Extremities:
LVOT:             Not well visualized    Upper              Appears normal
Extremities:

Other:   Fetus appears to be a male. Heels and 5th digit visualized. Nasal
bone visualized. Technically difficult due to fetal position.
Targeted Anatomy

Fetal Central Nervous System
Cisterna
Magna:
Cervix Uterus Adnexa

Cervical Length:    3.2       cm

Cervix:       Normal appearance by transabdominal scan.

Left Ovary:    Within normal limits.
Right Ovary:   Within normal limits.

Adnexa:     No abnormality visualized.
Impression

Single IUP at 25w 6d
Mild / borderline left urinary tract dilation is noted.  The left
renal pelvis measures 4 mm.
No calyceal dilation noted.  The right kidney appears
normal.
Somewhat limited views of the fetal heart obtained (LVOT)
The remainder of the anatomy appears normal.
Fetal growth is appropriate (51st %tile)
Normal amniotic fluid volume
Recommendations

Recommend follow-up ultrasound examination in 6 weeks
to reevaluate the fetal kidneys and to complete anatomy

AMRIT HENSEL with us.  Please do not hesitate to contact

## 2018-06-26 ENCOUNTER — Encounter (HOSPITAL_COMMUNITY): Payer: Self-pay

## 2018-06-26 ENCOUNTER — Emergency Department (HOSPITAL_COMMUNITY)
Admission: EM | Admit: 2018-06-26 | Discharge: 2018-06-26 | Disposition: A | Discharge status: Home / Self Care | Payer: Self-pay | Attending: Emergency Medicine | Admitting: Emergency Medicine

## 2018-06-26 ENCOUNTER — Other Ambulatory Visit: Payer: Self-pay

## 2018-06-26 ENCOUNTER — Emergency Department (HOSPITAL_COMMUNITY): Payer: Self-pay

## 2018-06-26 DIAGNOSIS — M79675 Pain in left toe(s): Secondary | ICD-10-CM | POA: Insufficient documentation

## 2018-06-26 MED ORDER — ACETAMINOPHEN 325 MG PO TABS
650.0000 mg | ORAL_TABLET | Freq: Once | ORAL | Status: AC
  Administered 2018-06-26: 650 mg via ORAL
  Filled 2018-06-26: qty 2

## 2018-06-26 NOTE — ED Provider Notes (Signed)
[facility] Note   CSN: [id number] Arrival date & time: 06/26/18  1903    History   Chief Complaint Chief Complaint  Patient presents with  . Toe Pain    HPI Janice Moore is a 28 y.o. female presents emergency department today with chief complaint of injury to her left middle toe.  This happened about 6 hours prior to arrival.  Patient states she was playing with her son and he stepped on her toe.  It caused her to have throbbing pain.  She tried elevating her foot but it did not help with the pain.  She states the pain has been constant.  She did not take anything for pain prior to arrival.  She denies fever, chills, numbness, weakness   Past Medical History:  Diagnosis Date  . Medical history non-contributory     There are no active problems to display for this patient.   Past Surgical History:  Procedure Laterality Date  . NO PAST SURGERIES       OB History    Gravida  2   Para  2   Term  2   Preterm      AB      Living  2     SAB      TAB      Ectopic      Multiple  0   Live Births  2            Home Medications    Prior to Admission medications   Medication Sig Start Date End Date Taking? Authorizing Provider  fluconazole (DIFLUCAN) 150 MG tablet Take 1 tablet (150 mg total) by mouth once. Can take additional dose three days later if symptoms persist 03/20/15   Federico FlakeNewton, Kimberly Niles, MD    Family History Family History  Problem Relation Age of Onset  . Diabetes Mother   . Diabetes Maternal Grandfather   . Diabetes Paternal Grandfather     Social History Social History   Tobacco Use  . Smoking status: Never Smoker  . Smokeless tobacco: Never Used  Substance Use Topics  . Alcohol use: No  . Drug use: No     Allergies   Patient has no known allergies.   Review of Systems Review of Systems  Musculoskeletal: Positive for arthralgias.  Skin: Negative for wound.   Allergic/Immunologic: Negative for immunocompromised state.     Physical Exam Updated Vital Signs BP 107/66 Comment: Simultaneous filing. User may not have seen previous data.  Pulse 69   Temp 97.7 F (36.5 C) (Oral) Comment: Simultaneous filing. User may not have seen previous data. Comment (Src): Simultaneous filing. User may not have seen previous data.  Resp 16 Comment: Simultaneous filing. User may not have seen previous data.  SpO2 98% Comment: Simultaneous filing. User may not have seen previous data.  Physical Exam Vitals signs and nursing note reviewed.  Constitutional:      Appearance: She is well-developed. She is not ill-appearing or toxic-appearing.  HENT:     Head: Normocephalic and atraumatic.     Nose: Nose normal.  Eyes:     General: No scleral icterus.       Right eye: No discharge.        Left eye: No discharge.     Conjunctiva/sclera: Conjunctivae normal.  Neck:     Musculoskeletal: Normal range of motion.     Vascular: No JVD.  Cardiovascular:     Rate  and Rhythm: Normal rate and regular rhythm.     Pulses: Normal pulses.          Dorsalis pedis pulses are 2+ on the right side and 2+ on the left side.     Heart sounds: Normal heart sounds.  Pulmonary:     Effort: Pulmonary effort is normal.     Breath sounds: Normal breath sounds.  Abdominal:     General: There is no distension.  Musculoskeletal: Normal range of motion.     Comments: Middle toe on left foot with bruising.  No skin tear or open wound.  No surrounding edema. she is able to wiggle all toes. Neurovascularly intact distally. Compartments soft above and below affected joint. Sensation intact to sharp and dull.  Full range of motion of left ankle and knee.  Skin:    General: Skin is warm and dry.  Neurological:     Mental Status: She is oriented to person, place, and time.     GCS: GCS eye subscore is 4. GCS verbal subscore is 5. GCS motor subscore is 6.     Comments: Fluent speech, no  facial droop.  Psychiatric:        Behavior: Behavior normal.      ED Treatments / Results  Labs (all labs ordered are listed, but only abnormal results are displayed) Labs Reviewed - No data to display  EKG None  Radiology Dg Foot Complete Left  Result Date: 06/26/2018 CLINICAL DATA:  Third toe injury and pain. EXAM: LEFT FOOT - COMPLETE 3+ VIEW COMPARISON:  None. FINDINGS: There is no evidence of fracture or dislocation. There is no evidence of arthropathy or other focal bone abnormality. Soft tissues are unremarkable. IMPRESSION: Negative. Electronically Signed   By: Monte Fantasia M.D.   On: 06/26/2018 20:25    Procedures Procedures (including critical care time)  Medications Ordered in ED Medications  acetaminophen (TYLENOL) tablet 650 mg (has no administration in time range)     Initial Impression / Assessment and Plan / ED Course  I have reviewed the triage vital signs and the nursing notes.  Pertinent labs & imaging results that were available during my care of the patient were reviewed by me and considered in my medical decision making (see chart for details).  Patient presents to the ED with complaints of pain to the middle toe left foot s/p injury son stepping on it. Exam without obvious deformity or open wounds. ROM intact. Non tender to palpation. NVI distally. Xray negative for fracture/dislocation. Therapeutic splint provided. PRICE and motrin recommended. I discussed results, treatment plan, need for follow-up, and return precautions with the patient. Provided opportunity for questions, patient confirmed understanding and are in agreement with plan.   This note was prepared using Dragon voice recognition software and may include unintentional dictation errors due to the inherent limitations of voice recognition software.   Final Clinical Impressions(s) / ED Diagnoses   Final diagnoses:  Pain of toe of left foot    ED Discharge Orders    None        Flint Melter 06/27/18 1107    Elnora Morrison, MD 06/27/18 2341

## 2018-06-26 NOTE — ED Triage Notes (Signed)
Pt reports pain/injury to left middle toe.

## 2018-06-26 NOTE — ED Notes (Signed)
Discharge instructions discussed with Pt. Pt verbalized understanding. Pt stable and ambulatory.    

## 2018-06-26 NOTE — Discharge Instructions (Addendum)
You were seen today for pain in your toe. Please read and follow all provided instructions.    1. Medications: alternate naprosyn and tylenol for pain control, usual home medications  2. Treatment: rest, ice, elevate and use brace, drink plenty of fluids, gentle stretching  3. Follow Up: Please followup with your PCP in 1 week if no improvement for discussion of your diagnoses and further evaluation after today's visit; if you do not have a primary care doctor use the resource guide provided to find one; Please return to the ER for worsening symptoms or other concerns

## 2021-01-21 IMAGING — CR LEFT FOOT - COMPLETE 3+ VIEW
3 series · 3 of 3 positions shown · non-contrast
Comparison: None.

CLINICAL DATA: Third toe injury and pain.

EXAM:
LEFT FOOT - COMPLETE 3+ VIEW

[foot ap]
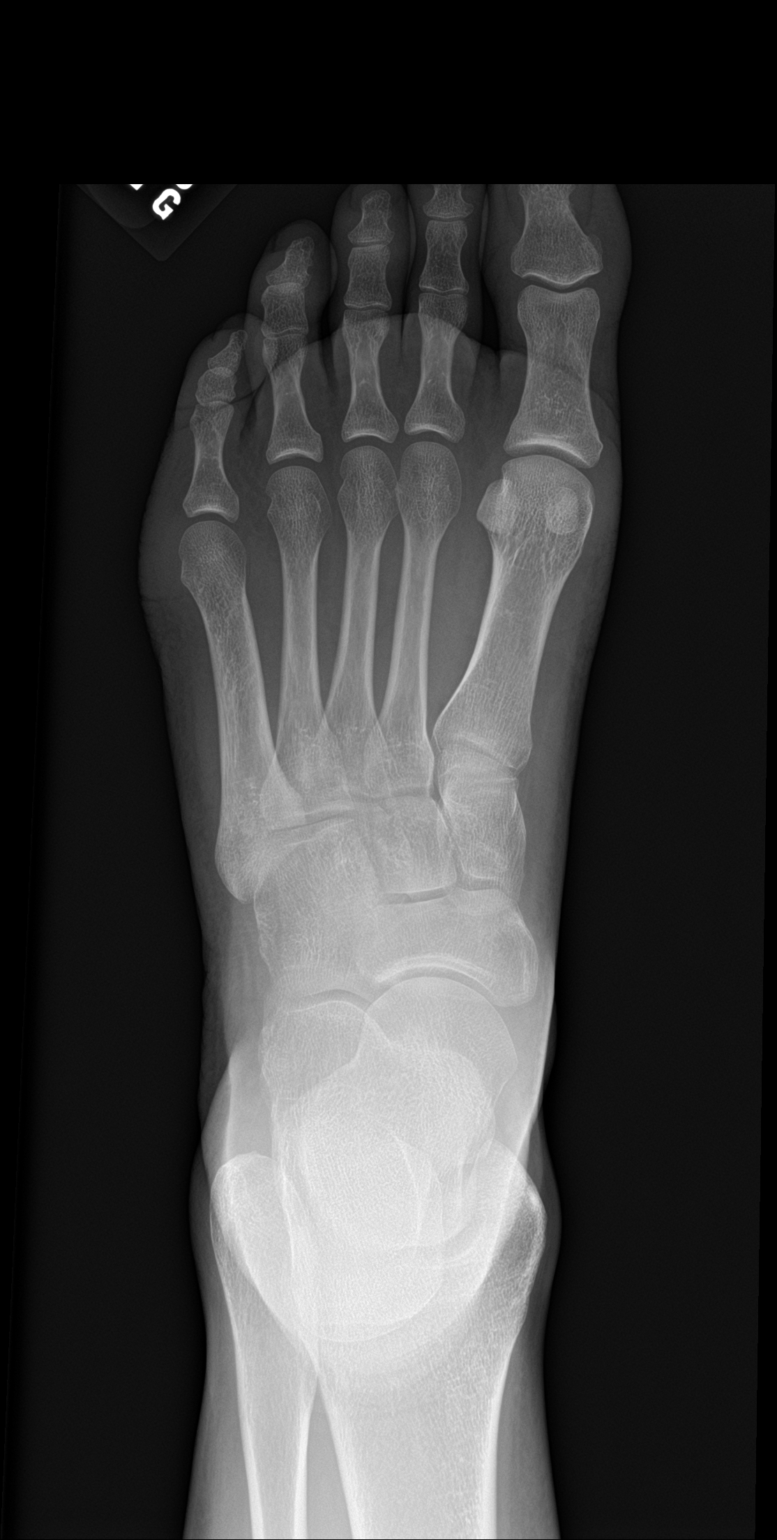

[foot obl]
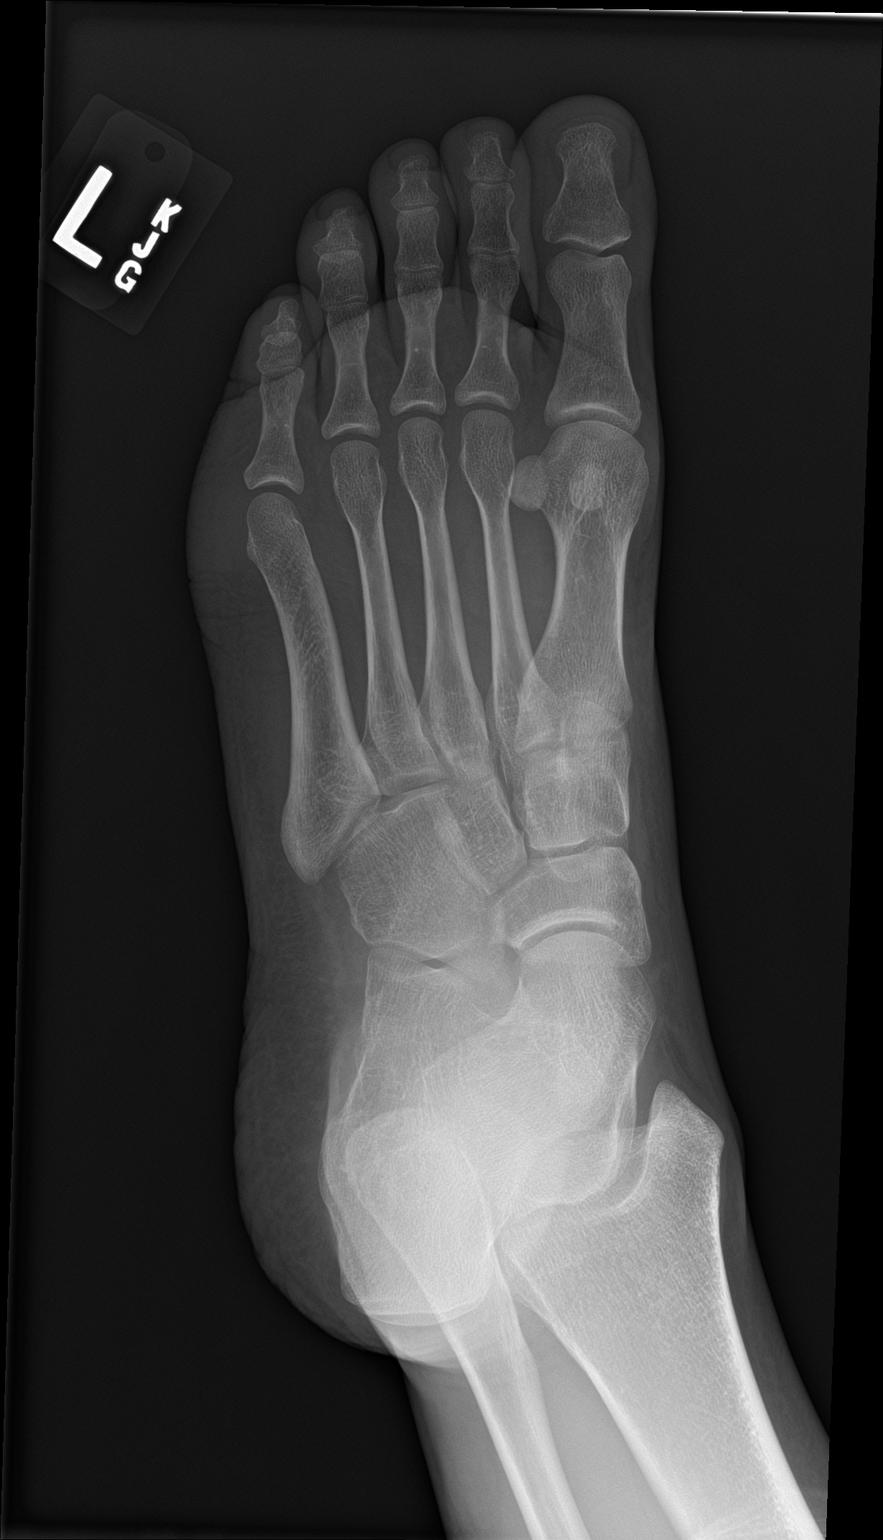

[foot lat]
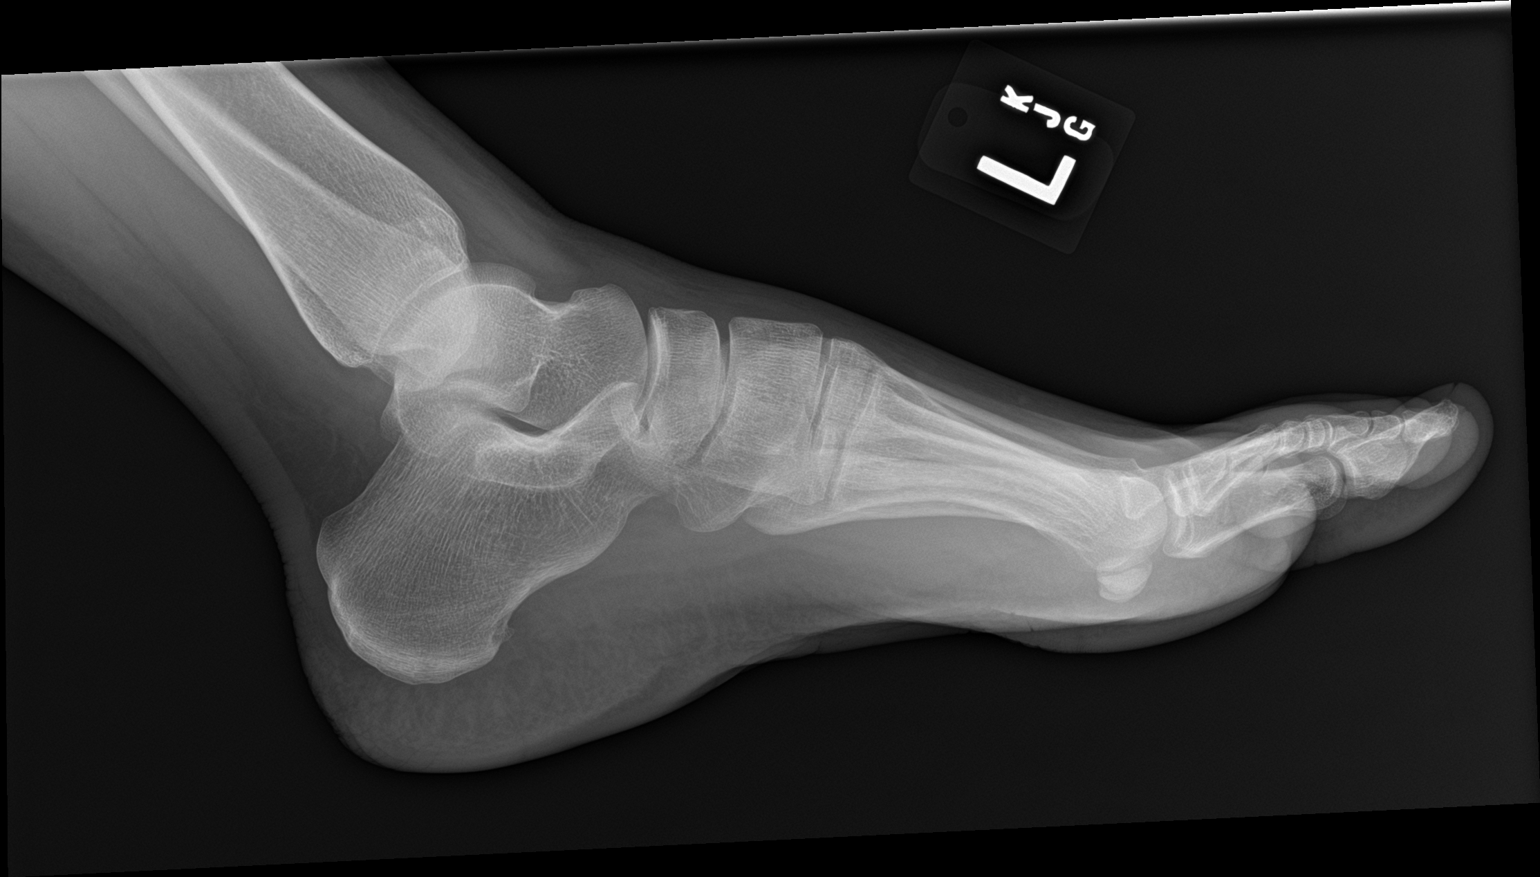

[3 of 3 positions shown; findings below may reference images not displayed]

FINDINGS: There is no evidence of fracture or dislocation. There is no
evidence of arthropathy or other focal bone abnormality. Soft
tissues are unremarkable.
IMPRESSION: Negative.

## 2023-04-15 ENCOUNTER — Ambulatory Visit
Admission: EM | Admit: 2023-04-15 | Discharge: 2023-04-15 | Disposition: A | Discharge status: Home / Self Care | Attending: Internal Medicine | Admitting: Internal Medicine

## 2023-04-15 ENCOUNTER — Encounter: Payer: Self-pay | Admitting: Emergency Medicine

## 2023-04-15 ENCOUNTER — Other Ambulatory Visit (HOSPITAL_COMMUNITY): Payer: Self-pay

## 2023-04-15 DIAGNOSIS — J069 Acute upper respiratory infection, unspecified: Secondary | ICD-10-CM

## 2023-04-15 MED ORDER — PREDNISONE 20 MG PO TABS
40.0000 mg | ORAL_TABLET | Freq: Every day | ORAL | 0 refills | Status: AC
  Filled 2023-04-15 (×2): qty 10, 5d supply, fill #0

## 2023-04-15 MED ORDER — BENZONATATE 100 MG PO CAPS
100.0000 mg | ORAL_CAPSULE | Freq: Three times a day (TID) | ORAL | 0 refills | Status: AC | PRN
  Filled 2023-04-15 (×2): qty 21, 7d supply, fill #0

## 2023-04-15 MED ORDER — BENZONATATE 100 MG PO CAPS: 100.0000 mg | ORAL_CAPSULE | Freq: Three times a day (TID) | ORAL | 0 refills | Status: DC | PRN

## 2023-04-15 MED ORDER — PREDNISONE 20 MG PO TABS: 40.0000 mg | ORAL_TABLET | Freq: Every day | ORAL | 0 refills | Status: DC

## 2023-04-15 NOTE — ED Triage Notes (Addendum)
 Pt reports nasal congestion, productive cough, sore throat, body aches, headache, and ear fullness x6 days. Temporary relief with mucinex at home. No at home testing completed. Denies fevers at home.

## 2023-04-15 NOTE — ED Provider Notes (Signed)
 [facility]    CSN: [id number] Arrival date & time: 04/15/23  0805      History   Chief Complaint Chief Complaint  Patient presents with   Nasal Congestion   Cough   Generalized Body Aches   Sore Throat    HPI Janice Moore is a 33 y.o. female.   Patient presents with nasal congestion, cough, generalized bodyaches, sore throat that started about 6 days ago.  Patient has taken Mucinex over-the-counter with some improvement in symptoms.  Denies history of asthma. Patient is not reporting any chest pain or shortness of breath.  Denies any fever or known sick contacts.   Cough Sore Throat    Past Medical History:  Diagnosis Date   Medical history non-contributory     There are no active problems to display for this patient.   Past Surgical History:  Procedure Laterality Date   NO PAST SURGERIES      OB History     Gravida  2   Para  2   Term  2   Preterm      AB      Living  2      SAB      IAB      Ectopic      Multiple  0   Live Births  2            Home Medications    Prior to Admission medications   Medication Sig Start Date End Date Taking? Authorizing Provider  benzonatate (TESSALON) 100 MG capsule Take 1 capsule (100 mg total) by mouth every 8 (eight) hours as needed for cough. 04/15/23   Gustavus Bryant, FNP  fluconazole (DIFLUCAN) 150 MG tablet Take 1 tablet (150 mg total) by mouth once. Can take additional dose three days later if symptoms persist Patient not taking: Reported on 04/15/2023 03/20/15   Federico Flake, MD  predniSONE (DELTASONE) 20 MG tablet Take 2 tablets (40 mg total) by mouth daily for 5 days. 04/15/23 04/20/23  Gustavus Bryant, FNP    Family History Family History  Problem Relation Age of Onset   Diabetes Mother    Diabetes Maternal Grandfather    Diabetes Paternal Grandfather     Social History Social History   Tobacco Use   Smoking status: Never   Smokeless tobacco: Never   Vaping Use   Vaping status: Never Used  Substance Use Topics   Alcohol use: No   Drug use: No     Allergies   Patient has no known allergies.   Review of Systems Review of Systems Per HPI  Physical Exam Triage Vital Signs ED Triage Vitals [04/15/23 0845]  Encounter Vitals Group     BP 105/68     Systolic BP Percentile      Diastolic BP Percentile      Pulse Rate (!) 58     Resp 16     Temp 98.2 F (36.8 C)     Temp Source Oral     SpO2 98 %     Weight      Height      Head Circumference      Peak Flow      Pain Score 4     Pain Loc      Pain Education      Exclude from Growth Chart    No data found.  Updated Vital Signs BP 105/68 (BP Location: Left Arm)   Pulse Marland Kitchen)  58   Temp 98.2 F (36.8 C) (Oral)   Resp 16   LMP 03/24/2023 (Exact Date)   SpO2 98%   Breastfeeding No   Visual Acuity Right Eye Distance:   Left Eye Distance:   Bilateral Distance:    Right Eye Near:   Left Eye Near:    Bilateral Near:     Physical Exam Constitutional:      General: She is not in acute distress.    Appearance: Normal appearance. She is not toxic-appearing or diaphoretic.  HENT:     Head: Normocephalic and atraumatic.     Right Ear: Tympanic membrane and ear canal normal.     Left Ear: Tympanic membrane and ear canal normal.     Nose: Congestion present.     Mouth/Throat:     Mouth: Mucous membranes are moist.     Pharynx: Posterior oropharyngeal erythema present.  Eyes:     Extraocular Movements: Extraocular movements intact.     Conjunctiva/sclera: Conjunctivae normal.     Pupils: Pupils are equal, round, and reactive to light.  Cardiovascular:     Rate and Rhythm: Normal rate and regular rhythm.     Pulses: Normal pulses.     Heart sounds: Normal heart sounds.  Pulmonary:     Effort: Pulmonary effort is normal. No respiratory distress.     Breath sounds: Normal breath sounds. No stridor. No wheezing, rhonchi or rales.  Musculoskeletal:        General:  Normal range of motion.     Cervical back: Normal range of motion.  Skin:    General: Skin is warm and dry.  Neurological:     General: No focal deficit present.     Mental Status: She is alert and oriented to person, place, and time. Mental status is at baseline.  Psychiatric:        Mood and Affect: Mood normal.        Behavior: Behavior normal.      UC Treatments / Results  Labs (all labs ordered are listed, but only abnormal results are displayed) Labs Reviewed - No data to display  EKG   Radiology No results found.  Procedures Procedures (including critical care time)  Medications Ordered in UC Medications - No data to display  Initial Impression / Assessment and Plan / UC Course  I have reviewed the triage vital signs and the nursing notes.  Pertinent labs & imaging results that were available during my care of the patient were reviewed by me and considered in my medical decision making (see chart for details).     Patient presents with symptoms likely from a viral upper respiratory infection. Do not suspect underlying cardiopulmonary process. Symptoms seem unlikely related to ACS, CHF or COPD exacerbations, pneumonia, pneumothorax. Patient is nontoxic appearing and not in need of emergent medical intervention. Viral testing deferred given duration of symptoms as it would not change treatment.  Recommended symptom control with medications, supportive care, symptom management, fluids, rest. Prednisone steroid burst prescribed for patient.   Return if symptoms fail to improve in 1-2 weeks or you develop shortness of breath, chest pain, severe headache. Patient states understanding and is agreeable.  Discharged with PCP followup.  Final Clinical Impressions(s) / UC Diagnoses   Final diagnoses:  Viral upper respiratory tract infection with cough     Discharge Instructions      Suspect that you have a viral illness as we discussed.  I have prescribed you  prednisone to  help with symptoms.  Cough medication also prescribed.  Please follow-up if symptoms persist or worsen.    ED Prescriptions     Medication Sig Dispense Auth. Provider   predniSONE (DELTASONE) 20 MG tablet  (Status: Discontinued) Take 2 tablets (40 mg total) by mouth daily for 5 days. 10 tablet [place], [place] [address], [place]   benzonatate (TESSALON) 100 MG capsule  (Status: Discontinued) Take 1 capsule (100 mg total) by mouth every 8 (eight) hours as needed for cough. 21 capsule [place], [place] [address], [place]   benzonatate (TESSALON) 100 MG capsule Take 1 capsule (100 mg total) by mouth every 8 (eight) hours as needed for cough. 21 capsule [place], [place] [address], [place]   predniSONE (DELTASONE) 20 MG tablet Take 2 tablets (40 mg total) by mouth daily for 5 days. 10 tablet Gustavus Bryant, [place]      PDMP not reviewed this encounter.   Gustavus Bryant, [place] 04/15/23 [phone]

## 2023-04-15 NOTE — Discharge Instructions (Signed)
 Suspect that you have a viral illness as we discussed.  I have prescribed you prednisone to help with symptoms.  Cough medication also prescribed.  Please follow-up if symptoms persist or worsen.
